# Patient Record
Sex: Male | Born: 1950 | Race: Black or African American | Hispanic: No | Marital: Married | Smoking: Never smoker
Health system: Southern US, Community
[De-identification: ages and names within clinical notes are randomized; demographics above are authoritative.]

## PROBLEM LIST (undated history)

## (undated) DIAGNOSIS — R7303 Prediabetes: Secondary | ICD-10-CM

## (undated) DIAGNOSIS — N433 Hydrocele, unspecified: Secondary | ICD-10-CM

## (undated) DIAGNOSIS — J984 Other disorders of lung: Secondary | ICD-10-CM

## (undated) HISTORY — PX: UMBILICAL HERNIA REPAIR: SHX196

## (undated) HISTORY — DX: Other disorders of lung: J98.4

## (undated) HISTORY — DX: Prediabetes: R73.03

---
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
## 2015-01-13 ENCOUNTER — Encounter: Payer: Self-pay | Admitting: Family Medicine

## 2015-01-20 ENCOUNTER — Ambulatory Visit (INDEPENDENT_AMBULATORY_CARE_PROVIDER_SITE_OTHER): Payer: Worker's Compensation | Admitting: Family Medicine

## 2015-01-20 ENCOUNTER — Ambulatory Visit (INDEPENDENT_AMBULATORY_CARE_PROVIDER_SITE_OTHER): Payer: Worker's Compensation

## 2015-01-20 ENCOUNTER — Encounter: Payer: Self-pay | Admitting: Family Medicine

## 2015-01-20 VITALS — BP 113/84 | HR 86 | Ht 68.0 in | Wt 167.0 lb

## 2015-01-20 DIAGNOSIS — M25561 Pain in right knee: Secondary | ICD-10-CM | POA: Insufficient documentation

## 2015-01-20 DIAGNOSIS — M5441 Lumbago with sciatica, right side: Secondary | ICD-10-CM

## 2015-01-20 DIAGNOSIS — M542 Cervicalgia: Secondary | ICD-10-CM

## 2015-01-20 DIAGNOSIS — M5440 Lumbago with sciatica, unspecified side: Secondary | ICD-10-CM | POA: Insufficient documentation

## 2015-01-20 DIAGNOSIS — M5442 Lumbago with sciatica, left side: Secondary | ICD-10-CM

## 2015-01-20 DIAGNOSIS — M5412 Radiculopathy, cervical region: Secondary | ICD-10-CM | POA: Diagnosis not present

## 2015-01-20 DIAGNOSIS — M4186 Other forms of scoliosis, lumbar region: Secondary | ICD-10-CM

## 2015-01-20 DIAGNOSIS — M25562 Pain in left knee: Secondary | ICD-10-CM

## 2015-01-20 DIAGNOSIS — M1711 Unilateral primary osteoarthritis, right knee: Secondary | ICD-10-CM

## 2015-01-20 DIAGNOSIS — R52 Pain, unspecified: Secondary | ICD-10-CM | POA: Diagnosis not present

## 2015-01-20 MED ORDER — AMITRIPTYLINE HCL 25 MG PO TABS: 25.0000 mg | ORAL_TABLET | Freq: Every day | ORAL | Status: DC

## 2015-01-20 NOTE — Patient Instructions (Addendum)
Thank you for coming in today. Come back or go to the emergency room if you notice new weakness new numbness problems walking or bowel or bladder problems. Take amitriptyline at bedtime for pain as needed.  Return in 4 weeks.   Attend physical therapy.

## 2015-01-20 NOTE — Addendum Note (Signed)
Addended by: Gregor Hams on: 01/20/2015 03:46 PM   Modules accepted: Orders

## 2015-01-20 NOTE — Addendum Note (Signed)
Addended by: Darla Lesches T on: 01/20/2015 03:20 PM   Modules accepted: Medications

## 2015-01-20 NOTE — Assessment & Plan Note (Signed)
X-ray pending. Trial of amitriptyline and physical therapy.

## 2015-01-20 NOTE — Addendum Note (Signed)
Addended by: Gregor Hams on: 01/20/2015 04:39 PM   Modules accepted: Level of Service

## 2015-01-20 NOTE — Assessment & Plan Note (Signed)
Meniscus injury versus DJD. X-ray pending. Return following x-ray.

## 2015-01-20 NOTE — Progress Notes (Addendum)
Michael Gallegos is a 64 y.o. male who presents to [facility]  today for establish care and discuss chronic neck, back and right knee pain.  Patient was injured on the job in 1998. Since then he has permanent disability. He notes neck and back pain moderate. He does have radiating pain and numbness to both hand and feet BL. He notes the hand tingling sometimes wakes up from sleep. Additionally he notes that he has sharp neck pain when he Valsalva's for a bowel movement. He denies any recent injuries weakness or numbness bowel bladder dysfunction fevers or chills. He has not tried any medications recently from naproxen which he takes intermittently.  Patient has right knee pain. This is been ongoing for years. He notes medial knee pain and swelling worse with activity. He notes popping denies locking or catching or giving way. He denies any recent knee injury.  History reviewed. No pertinent past medical history. History reviewed. No pertinent past surgical history. Social History  Substance Use Topics  . Smoking status: Never Smoker   . Smokeless tobacco: Not on file  . Alcohol Use: 0.0 oz/week    0 Standard drinks or equivalent per week   family history is not on file.  ROS as above Medications: Current Outpatient Prescriptions  Medication Sig Dispense Refill  . Naproxen (NAPROSYN PO) Take by mouth.    Marland Kitchen amitriptyline (ELAVIL) 25 MG tablet Take 1 tablet (25 mg total) by mouth at bedtime. 30 tablet 1   No current facility-administered medications for this visit.   No Known Allergies   Exam:  BP 113/84 mmHg  Pulse 86  Ht 5\' 8"  (1.727 m)  Wt 167 lb (75.751 kg)  BMI 25.40 kg/m2  Gen: Well NAD HEENT: EOMI,  MMM Lungs: Normal work of breathing. CTABL Heart: RRR no MRG Abd: NABS, Soft. Nondistended, Nontender Exts: Brisk capillary refill, warm and well perfused.  Neck: Nontender to midline. Normal neck range of motion. Negative Spurling's test  bilaterally. Upper extremity strength reflexes and sensation are intact and equal. Neck: Nontender to midline. Tender palpation bilateral lumbar paraspinals. Normal back range of motion. Largely strength and sensation are intact and equal throughout. Reflexes are equal and normal bilaterally. Knee: Normal-appearing without significant effusion. Normal range of motion. Mildly tender medial joint line. Positive medial McMurray's test. Negative Lachman's valgus and varus stress.   No results found for this or any previous visit (from the past 24 hour(s)). No results found.   Please see individual assessment and plan sections.     Note: Addendum note as patient showed up late for today's visit.

## 2015-01-21 NOTE — Progress Notes (Signed)
Quick Note:  Xray of neck shows lots of arthritis that could explain the pain. The back xray also shows arthritis. Both may need MRI in the future to tell us for sure what is going on if you do not get better. The knee xray also shows arthritis. ______

## 2015-01-27 ENCOUNTER — Ambulatory Visit: Payer: Self-pay | Admitting: Rehabilitative and Restorative Service Providers"

## 2015-02-18 ENCOUNTER — Ambulatory Visit: Payer: Self-pay | Admitting: Family Medicine

## 2015-02-25 ENCOUNTER — Encounter: Payer: Self-pay | Admitting: Family Medicine

## 2015-02-25 ENCOUNTER — Ambulatory Visit (INDEPENDENT_AMBULATORY_CARE_PROVIDER_SITE_OTHER): Payer: Worker's Compensation | Admitting: Family Medicine

## 2015-02-25 VITALS — BP 135/80 | HR 79 | Wt 173.0 lb

## 2015-02-25 DIAGNOSIS — M542 Cervicalgia: Secondary | ICD-10-CM | POA: Diagnosis not present

## 2015-02-25 DIAGNOSIS — M5442 Lumbago with sciatica, left side: Secondary | ICD-10-CM

## 2015-02-25 DIAGNOSIS — K219 Gastro-esophageal reflux disease without esophagitis: Secondary | ICD-10-CM | POA: Insufficient documentation

## 2015-02-25 DIAGNOSIS — M5441 Lumbago with sciatica, right side: Secondary | ICD-10-CM

## 2015-02-25 DIAGNOSIS — M25561 Pain in right knee: Secondary | ICD-10-CM | POA: Diagnosis not present

## 2015-02-25 DIAGNOSIS — M5412 Radiculopathy, cervical region: Secondary | ICD-10-CM

## 2015-02-25 MED ORDER — OMEPRAZOLE 40 MG PO CPDR: 40.0000 mg | DELAYED_RELEASE_CAPSULE | Freq: Every day | ORAL | Status: DC

## 2015-02-25 MED ORDER — NAPROXEN 375 MG PO TABS: 375.0000 mg | ORAL_TABLET | Freq: Two times a day (BID) | ORAL | Status: DC

## 2015-02-25 NOTE — Assessment & Plan Note (Addendum)
Chronic. Likely due to arthritis given XR. Will prescribe naproxen prn for pain. Patient deferred MRI

## 2015-02-25 NOTE — Assessment & Plan Note (Signed)
Chronic, worsening. Will order MRI and follow up thereafter.

## 2015-02-25 NOTE — Assessment & Plan Note (Signed)
Chronic, worsening. Radicular symptoms warrant MRI. Will follow up thereafter.

## 2015-02-25 NOTE — Patient Instructions (Signed)
Thank you for coming in today. You were seen for your neck, back and knee pain.  We will get MRI of your neck and back since the pain is likely coming from pinched nerves coming from your spine.  We will prescribe naproxen for pain as needed. We will prescribe omeprazole for the burning in your stomach. Please follow up 1-2 days after MRI.

## 2015-02-25 NOTE — Assessment & Plan Note (Signed)
Chronic. As above

## 2015-02-25 NOTE — Progress Notes (Signed)
Michael Gallegos is a 64 y.o. male who presents to [facility] today for neck pain, lumbago and R knee pain.   Patient had a work injury over 15 years ago witch chronic pain related to his neck and lower back.   1) The lower back pain bothers him most, with radiation to flanks, front and back of legs with burning in feet. He also notes his left and right legs giving out due to weakness. This is intermittent, usually related to lifting heavy objects. He notes occasional paresthesias. Patient was referred to PT but his insurance did not approve it. Patient has been recommended for surgery in the past, but has deferred to this point. He denies bowel or bladder changes.  For the pain, he tried amitriptyline once but vomited immediately, and has not taken any since.   2) Neck pain: patient notes pain in posterior neck and upper back. When he lifts heavy objects or rotates head, he gets burning pain shooting down both arms and bilateral posterior neck pain. He notes nightly paresthesias but no weakness.   3) R knee pain: chronic given arthritis found on recent XR. Naproxen has helped in past, but he has not had a prescription recently due to no provider.   4) Reflux: He also notes burning about 30 minutes after meals with occasional associated vomiting right after he eats. He has been on omeprazole in the past which has helped but his old doctor passed away and he so he has not had a prescription.   No past medical history on file. No past surgical history on file. Social History  Substance Use Topics  . Smoking status: Never Smoker   . Smokeless tobacco: Not on file  . Alcohol Use: 0.0 oz/week    0 Standard drinks or equivalent per week   family history is not on file.  ROS as above Medications: Current Outpatient Prescriptions  Medication Sig Dispense Refill  . naproxen (NAPROSYN) 375 MG tablet Take 1 tablet (375 mg total) by mouth 2 (two) times daily with a  meal. 60 tablet 2  . omeprazole (PRILOSEC) 40 MG capsule Take 1 capsule (40 mg total) by mouth daily. 30 capsule 12   No current facility-administered medications for this visit.   No Known Allergies   Exam:  BP 135/80 mmHg  Pulse 79  Wt 173 lb (78.472 kg) Gen: Well  Appearing gentleman in NAD on exam table  HEENT: EOMI,  MMM Lungs: Normal work of breathing. CTABL Heart: RRR no MRG Abd: NABS, Soft. Nondistended, Nontender Exts: Brisk capillary refill, warm and well perfused. Pulses x 4 2+ MSK;  Neck tender on base of neck. ROM exercises in all planes reproduce burning sensation shooting down arm as well as bilateral posterior neck pain, but no paresthesias or numbness.  Back pain palpable to central lower back over L4-5 region. Negative straight leg test.  R knee: bony deformity palpated medially with effusion  Neuro: sensation to light touch intact x 4 extremities  No results found for this or any previous visit (from the past 24 hour(s)). No results found.   Please see individual assessment and plan sections.

## 2015-03-09 ENCOUNTER — Other Ambulatory Visit: Payer: Self-pay | Admitting: Family Medicine

## 2015-06-19 DIAGNOSIS — J9811 Atelectasis: Secondary | ICD-10-CM | POA: Diagnosis not present

## 2015-06-19 DIAGNOSIS — R071 Chest pain on breathing: Secondary | ICD-10-CM | POA: Diagnosis not present

## 2015-06-19 DIAGNOSIS — J90 Pleural effusion, not elsewhere classified: Secondary | ICD-10-CM | POA: Diagnosis not present

## 2015-06-19 DIAGNOSIS — R350 Frequency of micturition: Secondary | ICD-10-CM | POA: Diagnosis not present

## 2015-06-20 DIAGNOSIS — R350 Frequency of micturition: Secondary | ICD-10-CM | POA: Diagnosis not present

## 2015-06-24 ENCOUNTER — Ambulatory Visit (INDEPENDENT_AMBULATORY_CARE_PROVIDER_SITE_OTHER): Payer: Medicare Other

## 2015-06-24 ENCOUNTER — Ambulatory Visit (INDEPENDENT_AMBULATORY_CARE_PROVIDER_SITE_OTHER): Payer: Medicare Other | Admitting: Family Medicine

## 2015-06-24 VITALS — BP 136/89 | HR 70 | Resp 12 | Wt 179.0 lb

## 2015-06-24 DIAGNOSIS — R05 Cough: Secondary | ICD-10-CM

## 2015-06-24 DIAGNOSIS — R079 Chest pain, unspecified: Secondary | ICD-10-CM | POA: Diagnosis not present

## 2015-06-24 DIAGNOSIS — J9811 Atelectasis: Secondary | ICD-10-CM

## 2015-06-24 DIAGNOSIS — J40 Bronchitis, not specified as acute or chronic: Secondary | ICD-10-CM | POA: Diagnosis not present

## 2015-06-24 MED ORDER — HYDROCODONE-HOMATROPINE 5-1.5 MG/5ML PO SYRP: 5.0000 mL | ORAL_SOLUTION | Freq: Three times a day (TID) | ORAL | Status: DC | PRN

## 2015-06-24 NOTE — Patient Instructions (Signed)
Thank you for coming in today. Get fasting blood work soon. Return for wellness visit. You should be called about cardiology referral soon. Call or go to the emergency room if you get worse, have trouble breathing, have chest pains, or palpitations.   Chest Wall Pain Chest wall pain is pain in or around the bones and muscles of your chest. Sometimes, an injury causes this pain. Sometimes, the cause may not be known. This pain may take several weeks or longer to get better. HOME CARE INSTRUCTIONS  Pay attention to any changes in your symptoms. Take these actions to help with your pain:   Rest as told by your health care provider.   Avoid activities that cause pain. These include any activities that use your chest muscles or your abdominal and side muscles to lift heavy items.   If directed, apply ice to the painful area:  Put ice in a plastic bag.  Place a towel between your skin and the bag.  Leave the ice on for 20 minutes, 2-3 times per day.  Take over-the-counter and prescription medicines only as told by your health care provider.  Do not use tobacco products, including cigarettes, chewing tobacco, and e-cigarettes. If you need help quitting, ask your health care provider.  Keep all follow-up visits as told by your health care provider. This is important. SEEK MEDICAL CARE IF:  You have a fever.  Your chest pain becomes worse.  You have new symptoms. SEEK IMMEDIATE MEDICAL CARE IF:  You have nausea or vomiting.  You feel sweaty or light-headed.  You have a cough with phlegm (sputum) or you cough up blood.  You develop shortness of breath.   This information is not intended to replace advice given to you by your health care provider. Make sure you discuss any questions you have with your health care provider.   Document Released: 03/06/2005 Document Revised: 11/25/2014 Document Reviewed: 06/01/2014 Elsevier Interactive Patient Education [employer].

## 2015-06-24 NOTE — Progress Notes (Signed)
       Michael Gallegos is a 65 y.o. male who presents to [facility] today for follow-up recent hospitalization. Patient developed vomiting and right-sided chest pain with deep inspiration a few days ago. He was seen in an urgent care locally on April 2.  His EKG was normal as well as his basic labs. A chest x-ray showed left basilar atelectasis and a small pleural effusion. He was given prednisone and tramadol. He notes improvement in symptoms. He specifically mild right-sided chest pain especially with deep inspiration. He denies any central or exertional chest pain. No pain radiating to the left arm or jaw. No significant shortness of breath or wheezing.  Patient notes in the past he has done well with Hycodan cough syrup and would like a refill of that if possible.   No past medical history on file. No past surgical history on file. Social History  Substance Use Topics  . Smoking status: Never Smoker   . Smokeless tobacco: Not on file  . Alcohol Use: 0.0 oz/week    0 Standard drinks or equivalent per week   family history is not on file.  ROS as above Medications: Current Outpatient Prescriptions  Medication Sig Dispense Refill  . predniSONE (DELTASONE) 20 MG tablet     . traMADol (ULTRAM) 50 MG tablet Take by mouth.    Marland Kitchen HYDROcodone-homatropine (HYCODAN) 5-1.5 MG/5ML syrup Take 5 mLs by mouth every 8 (eight) hours as needed for cough. 180 mL 0  . naproxen (NAPROSYN) 375 MG tablet Take 1 tablet (375 mg total) by mouth 2 (two) times daily with a meal. 60 tablet 2  . Omeprazole 20 MG TBEC Take by mouth.     No current facility-administered medications for this visit.   No Known Allergies   Exam:  BP 136/89 mmHg  Pulse 70  Resp 12  Wt 179 lb (81.194 kg)  SpO2 95% Gen: Well NAD HEENT: EOMI,  MMM Lungs: Normal work of breathing. CTABL Chest wall is nontender. Heart: RRR no  MRG Abd: NABS, Soft. Nondistended, Nontender Exts: Brisk capillary refill, warm and well perfused.   Chest x-ray: No significant atelectasis or pleural effusion visible. Awaiting formal radiology review.  12-lead EKG: Normal sinus rhythm at 60 bpm. No Q waves. No ST segment elevation. Flattened lateral precordial T waves are present. No EKGs to compare to.  No results found for this or any previous visit (from the past 24 hour(s)). No results found.   Please see individual assessment and plan sections.

## 2015-06-24 NOTE — Assessment & Plan Note (Addendum)
Chest pain is likely related to bronchitis or pleuritis. X-ray read pending but looks pretty normal to me. EKG with mild lateral precordial T waves. Will refer to cardiology for further evaluation of the chest pain. Patient may benefit from a stress test. Refill Hycodan. Discontinue tramadol. Continue prednisone. Follow-up for wellness visit in a few weeks Obtain fasting blood work.

## 2015-06-25 DIAGNOSIS — J90 Pleural effusion, not elsewhere classified: Secondary | ICD-10-CM | POA: Diagnosis not present

## 2015-06-25 DIAGNOSIS — R918 Other nonspecific abnormal finding of lung field: Secondary | ICD-10-CM | POA: Diagnosis not present

## 2015-06-25 NOTE — Addendum Note (Signed)
Addended by: Huel Cote on: 06/25/2015 10:57 AM   Modules accepted: Orders

## 2015-06-25 NOTE — Progress Notes (Signed)
Quick Note:  Xray is pretty normal appearing  ______

## 2015-07-05 ENCOUNTER — Telehealth: Payer: Self-pay | Admitting: Family Medicine

## 2015-07-05 DIAGNOSIS — J40 Bronchitis, not specified as acute or chronic: Secondary | ICD-10-CM | POA: Diagnosis not present

## 2015-07-05 DIAGNOSIS — Z1329 Encounter for screening for other suspected endocrine disorder: Secondary | ICD-10-CM | POA: Diagnosis not present

## 2015-07-05 DIAGNOSIS — R7303 Prediabetes: Secondary | ICD-10-CM | POA: Diagnosis not present

## 2015-07-05 DIAGNOSIS — Z13220 Encounter for screening for lipoid disorders: Secondary | ICD-10-CM | POA: Diagnosis not present

## 2015-07-05 DIAGNOSIS — E559 Vitamin D deficiency, unspecified: Secondary | ICD-10-CM | POA: Diagnosis not present

## 2015-07-05 MED ORDER — HYDROCODONE-HOMATROPINE 5-1.5 MG/5ML PO SYRP: 5.0000 mL | ORAL_SOLUTION | Freq: Three times a day (TID) | ORAL | Status: DC | PRN

## 2015-07-05 NOTE — Telephone Encounter (Signed)
Patient lost his Hycodan prescriptions as well as his labs from the last visit. He would like a refill if possible. I researched the patient in [place] controlled substance database and he has not filled this medicine and I called his preferred pharmacy and they have not filled this medicine. I do generally believe he lost it. We will refill.

## 2015-07-06 ENCOUNTER — Encounter: Payer: Self-pay | Admitting: Family Medicine

## 2015-07-06 DIAGNOSIS — E559 Vitamin D deficiency, unspecified: Secondary | ICD-10-CM | POA: Insufficient documentation

## 2015-07-06 DIAGNOSIS — R7303 Prediabetes: Secondary | ICD-10-CM | POA: Insufficient documentation

## 2015-07-06 LAB — COMPREHENSIVE METABOLIC PANEL
ALBUMIN: 4 g/dL (ref 3.6–5.1)
ALK PHOS: 45 U/L (ref 40–115)
ALT: 16 U/L (ref 9–46)
AST: 14 U/L (ref 10–35)
BUN: 15 mg/dL (ref 7–25)
CALCIUM: 9 mg/dL (ref 8.6–10.3)
CHLORIDE: 104 mmol/L (ref 98–110)
CO2: 26 mmol/L (ref 20–31)
Creat: 1 mg/dL (ref 0.70–1.25)
GLUCOSE: 89 mg/dL (ref 65–99)
Potassium: 4.2 mmol/L (ref 3.5–5.3)
SODIUM: 139 mmol/L (ref 135–146)
Total Bilirubin: 0.7 mg/dL (ref 0.2–1.2)
Total Protein: 6.5 g/dL (ref 6.1–8.1)

## 2015-07-06 LAB — HEMOGLOBIN A1C
Hgb A1c MFr Bld: 6.3 % — ABNORMAL HIGH (ref <5.7)
Mean Plasma Glucose: 134 mg/dL

## 2015-07-06 LAB — LIPID PANEL
Cholesterol: 187 mg/dL (ref 125–200)
HDL: 49 mg/dL (ref >=40)
LDL CALC: 104 mg/dL (ref <130)
TRIGLYCERIDES: 169 mg/dL — AB (ref <150)
Total CHOL/HDL Ratio: 3.8 Ratio (ref <=5.0)
VLDL: 34 mg/dL — AB (ref <30)

## 2015-07-06 LAB — CBC
HEMATOCRIT: 42.6 % (ref 38.5–50.0)
HEMOGLOBIN: 14.2 g/dL (ref 13.2–17.1)
MCH: 29.3 pg (ref 27.0–33.0)
MCHC: 33.3 g/dL (ref 32.0–36.0)
MCV: 88 fL (ref 80.0–100.0)
MPV: 9 fL (ref 7.5–12.5)
Platelets: 229 10*3/uL (ref 140–400)
RBC: 4.84 MIL/uL (ref 4.20–5.80)
RDW: 14.6 % (ref 11.0–15.0)
WBC: 5.1 10*3/uL (ref 3.8–10.8)

## 2015-07-06 LAB — VITAMIN D 25 HYDROXY (VIT D DEFICIENCY, FRACTURES): Vit D, 25-Hydroxy: 20 ng/mL — ABNORMAL LOW (ref 30–100)

## 2015-07-06 LAB — TSH: TSH: 0.86 mIU/L (ref 0.40–4.50)

## 2015-07-06 NOTE — Progress Notes (Signed)
Quick Note:  1) Vitamin D deficiency noted. Take 2000 units of vitamin D daily over-the-counter.  2) You have prediabetes. Work on lowering carbs in the diet. We will discuss this further. ______

## 2015-07-20 ENCOUNTER — Ambulatory Visit (INDEPENDENT_AMBULATORY_CARE_PROVIDER_SITE_OTHER): Payer: Medicare Other | Admitting: Family Medicine

## 2015-07-20 ENCOUNTER — Encounter: Payer: Self-pay | Admitting: Family Medicine

## 2015-07-20 VITALS — BP 120/78 | HR 97 | Wt 167.0 lb

## 2015-07-20 DIAGNOSIS — R55 Syncope and collapse: Secondary | ICD-10-CM | POA: Diagnosis not present

## 2015-07-20 MED ORDER — ZOSTER VACCINE LIVE 19400 UNT/0.65ML ~~LOC~~ SUSR: 0.6500 mL | Freq: Once | SUBCUTANEOUS | Status: DC

## 2015-07-20 NOTE — Patient Instructions (Signed)
Thank you for coming in today. We will refer to cardiology.  Return in 3 months.  Call or go to the emergency room if you get worse, have trouble breathing, have chest pains, or palpitations.

## 2015-07-20 NOTE — Progress Notes (Signed)
       Michael Gallegos is a 65 y.o. male who presents to [facility] today for follow up congestion and discuss history of syncope and vision loss.   1) Congestion: Patient was seen last month for bronchitis. He is feeling much better now with no fever, chills, CP, palpitations.   2) History of syncope. When asked Mr Macnab notes a history several times in his life where he has had syncopal events. This has occurred with dehydration but also with exertion without dehydration. He also notes a remote (more than 6 months) history of brief dizziness with partial visual field loss. He denies any other neurologic defects during these events.    No past medical history on file. No past surgical history on file. Social History  Substance Use Topics  . Smoking status: Never Smoker   . Smokeless tobacco: Not on file  . Alcohol Use: 0.0 oz/week    0 Standard drinks or equivalent per week   family history is not on file.  ROS as above Medications: Current Outpatient Prescriptions  Medication Sig Dispense Refill  . naproxen (NAPROSYN) 375 MG tablet Take 1 tablet (375 mg total) by mouth 2 (two) times daily with a meal. 60 tablet 2  . Omeprazole 20 MG TBEC Take by mouth.    . Zoster Vaccine Live, PF, (ZOSTAVAX) 36644 UNT/0.65ML injection Inject 19,400 Units into the skin once. If given in pharmacy fax report to Dr Georgina Snell [phone] 1 each 0   No current facility-administered medications for this visit.   No Known Allergies   Exam:  BP 120/78 mmHg  Pulse 97  Wt 167 lb (75.751 kg) Gen: Well NAD HEENT: EOMI,  MMM, PERRLA Lungs: Normal work of breathing. CTABL Heart: RRR no MRG Abd: NABS, Soft. Nondistended, Nontender Exts: Brisk capillary refill, warm and well perfused.  Neuro: AOx3 normal coordination, balance strength and gait.   EKG dated April 2017 reviewed.   Lipid panel reviewed. 10  year CVD risk <10%  No results found for this or any previous visit (from the past 24 hour(s)). No results found.   65 yo male with syncope history. Somewhat concerning. I think he may benefit from stress evaluation likely stress echo vs nuclear stress test.  Plan to refer to cardiology for further evaluation. He will f/u with me in few months. Will likely proceed with carotid US if not other explanation of symptoms.   Of note patient declined statins. His 10 year risk factor for CVD events is <10%.

## 2015-08-04 ENCOUNTER — Encounter: Payer: Self-pay | Admitting: Cardiology

## 2015-08-04 ENCOUNTER — Ambulatory Visit (INDEPENDENT_AMBULATORY_CARE_PROVIDER_SITE_OTHER): Payer: Medicare Other | Admitting: Cardiology

## 2015-08-04 VITALS — BP 116/81 | HR 93 | Ht 68.0 in | Wt 169.1 lb

## 2015-08-04 DIAGNOSIS — R55 Syncope and collapse: Secondary | ICD-10-CM

## 2015-08-04 DIAGNOSIS — R072 Precordial pain: Secondary | ICD-10-CM | POA: Diagnosis not present

## 2015-08-04 DIAGNOSIS — R079 Chest pain, unspecified: Secondary | ICD-10-CM | POA: Insufficient documentation

## 2015-08-04 NOTE — Progress Notes (Signed)
     HPI:  65 year old male for evaluation of syncope. Laboratories April 2017 showed normal hemoglobin, potassium 4.2, BUN/creatinine 15/1.0, normal TSH and chest x-ray with no acute findings. Patient states he has had 2 syncopal episodes in his life. Last was several years ago. He states he was in the sun and working hard and then had syncope. He does not have dyspnea on exertion, orthopnea, PND, pedal edema. He has chest heaviness. It is substernal and radiates to the back. It occurs with exertion and also after drinking certain liquids. It can last 2 days. He also describes occasional dizziness and tingling in his fingers.  Current Outpatient Prescriptions  Medication Sig Dispense Refill  . omeprazole (PRILOSEC) 40 MG capsule Take 40 mg by mouth daily.    . predniSONE (DELTASONE) 20 MG tablet Take 20 mg by mouth as directed.    . traMADol (ULTRAM) 50 MG tablet Take by mouth every 6 (six) hours as needed for moderate pain.     No current facility-administered medications for this visit.    No Known Allergies   Past Medical History  Diagnosis Date  . Prediabetes     Past Surgical History  Procedure Laterality Date  . Umbilical hernia repair      Social History   Social History  . Marital Status: Married    Spouse Name: N/A  . Number of Children: 7  . Years of Education: N/A   Occupational History  . Not on file.   Social History Main Topics  . Smoking status: Never Smoker   . Smokeless tobacco: Not on file  . Alcohol Use: 0.0 oz/week    0 Standard drinks or equivalent per week     Comment: Occasional  . Drug Use: No  . Sexual Activity: Not on file   Other Topics Concern  . Not on file   Social History Narrative    Family History  Problem Relation Age of Onset  . Hypertension Father   . Heart disease Neg Hx     ROS:  Leg and knee arthralgias but no fevers or chills, productive cough, hemoptysis, dysphasia, odynophagia, melena, hematochezia, dysuria,  hematuria, rash, seizure activity, orthopnea, PND, pedal edema, claudication. Remaining systems are negative.  Physical Exam:   Blood pressure 116/81, pulse 93, height 5\' 8"  (1.727 m), weight 169 lb 1.9 oz (76.712 kg).  General:  Well developed/well nourished in NAD Skin warm/dry Patient not depressed No peripheral clubbing Back-normal HEENT-normal/normal eyelids Neck supple/normal carotid upstroke bilaterally; no bruits; no JVD; no thyromegaly chest - CTA/ normal expansion CV - RRR/normal S1 and S2; no murmurs, rubs or gallops;  PMI nondisplaced Abdomen -NT/ND, no HSM, no mass, + bowel sounds, no bruit 2+ femoral pulses, no bruits Ext-no edema, chords, 2+ DP Neuro-grossly nonfocal  ECG 06/24/2015-Sinus rhythm, anterior T-wave inversion.

## 2015-08-04 NOTE — Assessment & Plan Note (Addendum)
Patient has had 2 previous syncopal episodes likely secondary to dehydration. No recent episodes in the past several years. We'll arrange echocardiogram to assess LV function.

## 2015-08-04 NOTE — Assessment & Plan Note (Addendum)
Symptoms difficult to assess. He has some exertional chest pain but his symptoms also occur after drinking certain liquids. Plan stress echocardiogram. If normal we'll not pursue further ischemia evaluation. Electrocardiogram shows anterior T-wave inversion and therefore he will require echo imaging with the stress test.

## 2015-08-04 NOTE — Patient Instructions (Signed)
Medication Instructions:   NO CHANGE  Testing/Procedures:  Your physician has requested that you have an echocardiogram. Echocardiography is a painless test that uses sound waves to create images of your heart. It provides your doctor with information about the size and shape of your heart and how well your heart's chambers and valves are working. This procedure takes approximately one hour. There are no restrictions for this procedure.   Your physician has requested that you have a stress echocardiogram. For further information please visit [email or web address]. Please follow instruction sheet as given.    Follow-Up:  Your physician recommends that you schedule a follow-up appointment in: AS NEEDED PENDING TEST RESULTS   Exercise Stress Echocardiogram An exercise stress echocardiogram is a heart (cardiac) test used to check the function of your heart. This test may also be called an exercise stress echocardiography or stress echo. This stress test will check how well your heart muscle and valves are working and determine if your heart muscle is getting enough blood. You will exercise on a treadmill to naturally increase or stress the functioning of your heart.  An echocardiogram uses sound waves (ultrasound) to produce an image of your heart. If your heart does not work normally, it may indicate coronary artery disease with poor coronary blood supply. The coronary arteries are the arteries that bring blood and oxygen to your heart. LET [facility] CARE PROVIDER KNOW ABOUT:  Any allergies you have.  All medicines you are taking, including vitamins, herbs, eye drops, creams, and over-the-counter medicines.  Previous problems you or members of your family have had with the use of anesthetics.  Any blood disorders you have.  Previous surgeries you have had.  Medical conditions you have.  Possibility of pregnancy, if this applies. RISKS AND COMPLICATIONS Generally, this is a safe  procedure. However, as with any procedure, complications can occur. Possible complications can include:  You develop pain or pressure in the following areas:  Chest.  Jaw or neck.  Between your shoulder blades.  Radiating down your left arm.  Dizziness or lightheadedness.  Shortness of breath.  Increased or irregular heartbeat.  Nausea or vomiting.  Heart attack (rare). BEFORE THE PROCEDURE  Avoid all forms of caffeine for 24 hours before your test or as directed by your health care provider. This includes coffee, tea (even decaffeinated tea), caffeinated sodas, chocolate, cocoa, and certain pain medicines.  Follow your health care provider's instructions regarding eating and drinking before the test.  Take your medicines as directed at regular times with water unless instructed otherwise. Exceptions may include:  If you have diabetes, ask how you are to take your insulin or pills. It is common to adjust insulin dosing the morning of the test.  If you are taking beta-blocker medicines, it is important to talk to your health care provider about these medicines well before the date of your test. Taking beta-blocker medicines may interfere with the test. In some cases, these medicines need to be changed or stopped 24 hours or more before the test.  If you wear a nitroglycerin patch, it may need to be removed prior to the test. Ask your health care provider if the patch should be removed before the test.  If you use an inhaler for any breathing condition, bring it with you to the test.  If you are an outpatient, bring a snack so you can eat right after the stress phase of the test.  Do not smoke for 4 hours prior to the  test or as directed by your health care provider.  Wear loose-fitting clothes and comfortable shoes for the test. This test involves walking on a treadmill. PROCEDURE   Multiple electrodes will be put on your chest. If needed, small areas of your chest may be  shaved to get better contact with the electrodes. Once the electrodes are attached to your body, multiple wires will be attached to the electrodes, and your heart rate will be monitored.  You will have an echocardiogram done at rest.  To produce this image of your heart, gel is applied to your chest, and a wand-like tool (transducer) is moved over the chest. The transducer sends the sound waves through the chest to create the moving images of your heart.  You may need an IV to receive a medication that improves the quality of the pictures.  You will then walk on a treadmill. The treadmill will be started at a slow pace. The treadmill speed and incline will gradually be increased to raise your heart rate.  At the peak of exercise, the treadmill will be stopped. You will lie down immediately on a bed so that a second echocardiogram can be done to visualize your heart's motion with exercise.  The test usually takes 30-60 minutes to complete. AFTER THE PROCEDURE  Your heart rate and blood pressure will be monitored after the test.  You may return to your normal schedule, including diet, activities, and medicines, unless your health care provider tells you otherwise.   This information is not intended to replace advice given to you by your health care provider. Make sure you discuss any questions you have with your health care provider.   Document Released: 03/10/2004 Document Revised: 03/11/2013 Document Reviewed: 11/11/2012 Elsevier Interactive Patient Education [employer].

## 2015-08-20 ENCOUNTER — Telehealth (HOSPITAL_COMMUNITY): Payer: Self-pay | Admitting: *Deleted

## 2015-08-20 NOTE — Telephone Encounter (Signed)
Left message on voicemail in reference to upcoming appointment scheduled for 08/23/15. Phone number given for a call back so details instructions can be given. Michael Gallegos

## 2015-08-23 ENCOUNTER — Ambulatory Visit (HOSPITAL_BASED_OUTPATIENT_CLINIC_OR_DEPARTMENT_OTHER): Payer: Medicare Other

## 2015-08-23 ENCOUNTER — Ambulatory Visit (HOSPITAL_COMMUNITY): Payer: Medicare Other | Attending: Cardiology

## 2015-08-23 ENCOUNTER — Other Ambulatory Visit: Payer: Self-pay

## 2015-08-23 DIAGNOSIS — R55 Syncope and collapse: Secondary | ICD-10-CM

## 2015-08-23 DIAGNOSIS — I517 Cardiomegaly: Secondary | ICD-10-CM | POA: Insufficient documentation

## 2015-08-23 DIAGNOSIS — I071 Rheumatic tricuspid insufficiency: Secondary | ICD-10-CM | POA: Insufficient documentation

## 2015-08-23 LAB — ECHOCARDIOGRAM COMPLETE
CHL CUP DOP CALC LVOT VTI: 21.1 cm
E decel time: 204 msec
EERAT: 6.61
FS: 34 % (ref 28–44)
IV/PV OW: 0.84
LA diam end sys: 36 cm
LA vol: 40 cm3
LADIAMINDEX: 1.89 cm/m2
LASIZE: 36 cm
LAVOLA4C: 37 mL
LAVOLIN: 21.1 mL/m2
LDCA: 3.8 cm2
LV E/e' medial: 6.61
LV PW d: 13.4 mm — AB (ref 0.6–1.1)
LV TDI E'MEDIAL: 8.33
LVEEAVG: 6.61
LVELAT: 9.87 cm/s
LVOT SV: 80 cm3
LVOT diameter: 22 mm
LVOT peak vel: 105 cm/s
MV Dec: 204
MVPKAVEL: 63.7 m/s
MVPKEVEL: 65.2 m/s
Reg peak vel: 263 cm/s
TDI e' lateral: 9.87
TR max vel: 263 m/s

## 2015-08-24 DIAGNOSIS — J920 Pleural plaque with presence of asbestos: Secondary | ICD-10-CM | POA: Insufficient documentation

## 2015-08-24 DIAGNOSIS — R05 Cough: Secondary | ICD-10-CM | POA: Diagnosis not present

## 2015-08-27 ENCOUNTER — Telehealth: Payer: Self-pay | Admitting: Cardiology

## 2015-08-27 NOTE — Telephone Encounter (Signed)
Did not need this encounter °

## 2015-09-08 ENCOUNTER — Encounter: Payer: Self-pay | Admitting: *Deleted

## 2015-10-13 ENCOUNTER — Encounter: Payer: Self-pay | Admitting: Family Medicine

## 2015-10-13 ENCOUNTER — Ambulatory Visit (INDEPENDENT_AMBULATORY_CARE_PROVIDER_SITE_OTHER): Payer: Medicare Other | Admitting: Family Medicine

## 2015-10-13 VITALS — BP 127/76 | HR 95 | Temp 98.1°F | Wt 170.0 lb

## 2015-10-13 DIAGNOSIS — L237 Allergic contact dermatitis due to plants, except food: Secondary | ICD-10-CM

## 2015-10-13 MED ORDER — TRIAMCINOLONE ACETONIDE 0.1 % EX CREA: 1.0000 "application " | TOPICAL_CREAM | Freq: Two times a day (BID) | CUTANEOUS | 0 refills | Status: DC

## 2015-10-13 MED ORDER — PREDNISONE 5 MG (48) PO TBPK: ORAL_TABLET | ORAL | 0 refills | Status: DC

## 2015-10-13 NOTE — Patient Instructions (Signed)
Thank you for coming in today.   Poison [employer] ivy is a inflammation of the skin (contact dermatitis) caused by touching the allergens on the leaves of the ivy plant following previous exposure to the plant. The rash usually appears 48 hours after exposure. The rash is usually bumps (papules) or blisters (vesicles) in a linear pattern. Depending on your own sensitivity, the rash may simply cause redness and itching, or it may also progress to blisters which may break open. These must be well cared for to prevent secondary bacterial (germ) infection, followed by scarring. Keep any open areas dry, clean, dressed, and covered with an antibacterial ointment if needed. The eyes may also get puffy. The puffiness is worst in the morning and gets better as the day progresses. This dermatitis usually heals without scarring, within 2 to 3 weeks without treatment. HOME CARE INSTRUCTIONS  Thoroughly wash with soap and water as soon as you have been exposed to poison ivy. You have about one half hour to remove the plant resin before it will cause the rash. This washing will destroy the oil or antigen on the skin that is causing, or will cause, the rash. Be sure to wash under your fingernails as any plant resin there will continue to spread the rash. Do not rub skin vigorously when washing affected area. Poison ivy cannot spread if no oil from the plant remains on your body. A rash that has progressed to weeping sores will not spread the rash unless you have not washed thoroughly. It is also important to wash any clothes you have been wearing as these may carry active allergens. The rash will return if you wear the unwashed clothing, even several days later. Avoidance of the plant in the future is the best measure. Poison ivy plant can be recognized by the number of leaves. Generally, poison ivy has three leaves with flowering branches on a single stem. Diphenhydramine may be purchased over the counter and used as  needed for itching. Do not drive with this medication if it makes you drowsy.Ask your caregiver about medication for children. SEEK MEDICAL CARE IF:  Open sores develop.  Redness spreads beyond area of rash.  You notice purulent (pus-like) discharge.  You have increased pain.  Other signs of infection develop (such as fever).   This information is not intended to replace advice given to you by your health care provider. Make sure you discuss any questions you have with your health care provider.   Document Released: 03/03/2000 Document Revised: 05/29/2011 Document Reviewed: 08/12/2014 Elsevier Interactive Patient Education [employer].

## 2015-10-13 NOTE — Progress Notes (Signed)
       Amarey Kok is a 65 y.o. male who presents to [place] today for poison ivy dermatitis. Patient was exposed to poison ivy last week. Is not worsening. Anicteric erythematous rash on his trunk and extremities extremities. He's tried using over-the-counter medications and creams including Benadryl hydrocortisone cream and has not had much benefit. He denies any fevers or chills   Past Medical History:  Diagnosis Date  . Prediabetes    Past Surgical History:  Procedure Laterality Date  . UMBILICAL HERNIA REPAIR     Social History  Substance Use Topics  . Smoking status: Never Smoker  . Smokeless tobacco: Not on file  . Alcohol use 0.0 oz/week     Comment: Occasional   family history includes Hypertension in his father.  ROS as above:  Medications: Current Outpatient Prescriptions  Medication Sig Dispense Refill  . omeprazole (PRILOSEC) 40 MG capsule Take 40 mg by mouth daily.    . predniSONE (STERAPRED UNI-PAK 48 TAB) 5 MG (48) TBPK tablet 12 day dosepack po 48 tablet 0  . triamcinolone cream (KENALOG) 0.1 % Apply 1 application topically 2 (two) times daily. 453.6 g 0   No current facility-administered medications for this visit.    No Known Allergies   Exam:  BP 127/76   Pulse 95   Temp 98.1 F (36.7 C) (Oral)   Wt 170 lb (77.1 kg)   SpO2 97%   BMI 25.85 kg/m  Gen: Well NAD Skin: Multiple areas of erythematous maculopapular rash with some vesicles. Nontender. Blanchable. Rashes occurs on trunk and extremities  No results found for this or any previous visit (from the past 24 hour(s)). No results found.    Assessment and Plan: 65 y.o. male with poison ivy dermatitis. Due to widespread distribution we'll use prednisone Dosepak and topical triamcinolone cream. Return as needed.   No orders of the defined types were placed in this  encounter.   Discussed warning signs or symptoms. Please see discharge instructions. Patient expresses understanding.

## 2015-10-21 ENCOUNTER — Ambulatory Visit (INDEPENDENT_AMBULATORY_CARE_PROVIDER_SITE_OTHER): Payer: Medicare Other

## 2015-10-21 ENCOUNTER — Ambulatory Visit (INDEPENDENT_AMBULATORY_CARE_PROVIDER_SITE_OTHER): Payer: Medicare Other | Admitting: Family Medicine

## 2015-10-21 ENCOUNTER — Encounter: Payer: Self-pay | Admitting: Family Medicine

## 2015-10-21 VITALS — BP 145/84 | HR 85 | Wt 170.0 lb

## 2015-10-21 DIAGNOSIS — M549 Dorsalgia, unspecified: Secondary | ICD-10-CM | POA: Diagnosis not present

## 2015-10-21 DIAGNOSIS — R350 Frequency of micturition: Secondary | ICD-10-CM | POA: Diagnosis not present

## 2015-10-21 DIAGNOSIS — R319 Hematuria, unspecified: Secondary | ICD-10-CM | POA: Diagnosis not present

## 2015-10-21 DIAGNOSIS — R1084 Generalized abdominal pain: Secondary | ICD-10-CM | POA: Diagnosis not present

## 2015-10-21 LAB — POCT URINALYSIS DIPSTICK
BILIRUBIN UA: NEGATIVE
GLUCOSE UA: NEGATIVE
KETONES UA: NEGATIVE
LEUKOCYTES UA: NEGATIVE
Nitrite, UA: NEGATIVE
PROTEIN UA: NEGATIVE
SPEC GRAV UA: 1.02
Urobilinogen, UA: 0.2
pH, UA: 6

## 2015-10-21 MED ORDER — POLYETHYLENE GLYCOL 3350 17 GM/SCOOP PO POWD: 17.0000 g | Freq: Every day | ORAL | 1 refills | Status: DC

## 2015-10-21 MED ORDER — CIPROFLOXACIN HCL 500 MG PO TABS: 500.0000 mg | ORAL_TABLET | Freq: Two times a day (BID) | ORAL | 0 refills | Status: DC

## 2015-10-21 NOTE — Patient Instructions (Addendum)
Thank you for coming in today. We will call with lab results.  Take Cipro antibiotics twice daily for 10 days.  Return if worse or not better.  If your belly pain worsens, or you have high fever, bad vomiting, blood in your stool or black tarry stool go to the Emergency Room.

## 2015-10-21 NOTE — Progress Notes (Signed)
Michael Gallegos is a 65 y.o. male who presents to [place] today for abdominal pain. Patient notes lower abdominal pain with urinary frequency and urgency. Symptoms present for a few days. No fevers chills vomiting or diarrhea. No frank chest pain. No radiating pain. He notes he was recently given prednisone for poison ivy. He stopped taking the prednisone and symptoms of not improved. Pain is felt bilaterally in the pelvis and tends to come and go. Patient notes a chronic history of constipation and he notes that he has been little more constipated than usual recently   Past Medical History:  Diagnosis Date  . Prediabetes    Past Surgical History:  Procedure Laterality Date  . UMBILICAL HERNIA REPAIR     Social History  Substance Use Topics  . Smoking status: Never Smoker  . Smokeless tobacco: Not on file  . Alcohol use 0.0 oz/week     Comment: Occasional   family history includes Hypertension in his father.  ROS as above:  Medications: Current Outpatient Prescriptions  Medication Sig Dispense Refill  . omeprazole (PRILOSEC) 40 MG capsule Take 40 mg by mouth daily.    . predniSONE (STERAPRED UNI-PAK 48 TAB) 5 MG (48) TBPK tablet 12 day dosepack po 48 tablet 0  . triamcinolone cream (KENALOG) 0.1 % Apply 1 application topically 2 (two) times daily. 453.6 g 0  . ciprofloxacin (CIPRO) 500 MG tablet Take 1 tablet (500 mg total) by mouth 2 (two) times daily. 20 tablet 0  . polyethylene glycol powder (GLYCOLAX/MIRALAX) powder Take 17 g by mouth daily. 850 g 1   No current facility-administered medications for this visit.    No Known Allergies   Exam:  BP (!) 145/84   Pulse 85   Wt 170 lb (77.1 kg)   SpO2 100%   BMI 25.85 kg/m  Gen: Well NAD HEENT: EOMI,  MMM Lungs: Normal work of breathing. CTABL Heart: RRR no MRG Abd: NABS, Soft. Nondistended,  Mildly tender with no rebound or guarding or masses palpated. No CVA angle tenderness to percussion Exts: Brisk capillary refill, warm and well perfused.   Results for orders placed or performed in visit on 10/21/15 (from the past 24 hour(s))  POCT Urinalysis Dipstick     Status: Abnormal   Collection Time: 10/21/15  1:51 PM  Result Value Ref Range   Color, UA yellow    Clarity, UA clear    Glucose, UA neg    Bilirubin, UA neg    Ketones, UA neg    Spec Grav, UA 1.020    Blood, UA trace-lysed    pH, UA 6.0    Protein, UA neg    Urobilinogen, UA 0.2    Nitrite, UA neg    Leukocytes, UA Negative Negative   Ct Renal Stone Study  Result Date: 10/21/2015 CLINICAL DATA:  Back pain radiating into the abdomen for 5-6 days. Hematuria. Initial encounter. EXAM: CT ABDOMEN AND PELVIS WITHOUT CONTRAST TECHNIQUE: Multidetector CT imaging of the abdomen and pelvis was performed following the standard protocol without IV contrast. COMPARISON:  None. FINDINGS: Small calcified pleural plaques are seen in the lower lung zones bilaterally. Emphysematous change in the lung bases is noted. No pleural or pericardial effusion. The kidneys appear normal bilaterally. No renal or ureteral stones are seen on the right or left. No urinary bladder stones are identified. Prostate gland is unremarkable. The gallbladder, spleen, adrenal glands and pancreas appear normal.  A few small low attenuating lesions in the liver cannot be definitively characterized but are likely cysts. The stomach, small and large bowel and appendix appear normal. No lymphadenopathy or fluid. Small fat containing inguinal hernias are seen bilaterally. No lytic or sclerotic bony lesion. Lower lumbar facet degenerative disease is noted. IMPRESSION: Negative for urinary tract stone. No acute abnormality or finding to explain the patient's symptoms. Small fat containing inguinal hernias bilaterally. 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 Electronically Signed   By: Inge Rise M.D.   On: 10/21/2015 14:31      Assessment and Plan: 65 y.o. male with abdominal pain was hematuria and urinary frequency. Initially concern for kidney stones however as noted above the CT scan was negative for stones. I'm more concerned about urinary tract infection at this point. Plan for urine culture and empiric treatment with Cipro. Additionally symptoms may be due to constipation. Will treat with MiraLAX. Additionally obtain CBC CMP and lipase. Return if not better.    Orders Placed This Encounter  Procedures  . Urine Culture  . CT RENAL STONE STUDY    Standing Status:   Future    Number of Occurrences:   1    Standing Expiration Date:   01/20/2017    Order Specific Question:   Reason for Exam (SYMPTOM  OR DIAGNOSIS REQUIRED)    Answer:   eval possible kidney stone. Pain and hematuria    Order Specific Question:   Preferred imaging location?    Answer:   [facility]  . CBC  . Comprehensive metabolic panel    Order Specific Question:   Has the patient fasted?    Answer:   No  . Lipase  . POCT Urinalysis Dipstick    Discussed warning signs or symptoms. Please see discharge instructions. Patient expresses understanding.

## 2015-10-22 LAB — CBC
HCT: 44.2 % (ref 38.5–50.0)
Hemoglobin: 15 g/dL (ref 13.2–17.1)
MCH: 30.4 pg (ref 27.0–33.0)
MCHC: 33.9 g/dL (ref 32.0–36.0)
MCV: 89.7 fL (ref 80.0–100.0)
MPV: 9 fL (ref 7.5–12.5)
PLATELETS: 271 10*3/uL (ref 140–400)
RBC: 4.93 MIL/uL (ref 4.20–5.80)
RDW: 13.5 % (ref 11.0–15.0)
WBC: 5.2 10*3/uL (ref 3.8–10.8)

## 2015-10-22 LAB — LIPASE: LIPASE: 30 U/L (ref 7–60)

## 2015-10-22 LAB — COMPREHENSIVE METABOLIC PANEL
ALT: 13 U/L (ref 9–46)
AST: 14 U/L (ref 10–35)
Albumin: 4.4 g/dL (ref 3.6–5.1)
Alkaline Phosphatase: 49 U/L (ref 40–115)
BUN: 14 mg/dL (ref 7–25)
CHLORIDE: 99 mmol/L (ref 98–110)
CO2: 30 mmol/L (ref 20–31)
Calcium: 9.5 mg/dL (ref 8.6–10.3)
Creat: 0.95 mg/dL (ref 0.70–1.25)
Glucose, Bld: 84 mg/dL (ref 65–99)
POTASSIUM: 4.8 mmol/L (ref 3.5–5.3)
Sodium: 136 mmol/L (ref 135–146)
TOTAL PROTEIN: 7.2 g/dL (ref 6.1–8.1)
Total Bilirubin: 0.4 mg/dL (ref 0.2–1.2)

## 2015-10-23 LAB — URINE CULTURE: Organism ID, Bacteria: NO GROWTH

## 2015-11-11 ENCOUNTER — Encounter: Payer: Self-pay | Admitting: Family Medicine

## 2015-12-07 ENCOUNTER — Other Ambulatory Visit: Payer: Self-pay | Admitting: Family Medicine

## 2015-12-07 DIAGNOSIS — M5441 Lumbago with sciatica, right side: Secondary | ICD-10-CM

## 2015-12-07 DIAGNOSIS — M5442 Lumbago with sciatica, left side: Secondary | ICD-10-CM

## 2015-12-07 DIAGNOSIS — M542 Cervicalgia: Secondary | ICD-10-CM

## 2015-12-07 DIAGNOSIS — M5412 Radiculopathy, cervical region: Secondary | ICD-10-CM

## 2015-12-09 ENCOUNTER — Other Ambulatory Visit: Payer: Self-pay | Admitting: Family Medicine

## 2015-12-10 ENCOUNTER — Other Ambulatory Visit: Payer: Self-pay | Admitting: Family Medicine

## 2015-12-13 ENCOUNTER — Other Ambulatory Visit: Payer: Self-pay | Admitting: Family Medicine

## 2015-12-14 ENCOUNTER — Ambulatory Visit: Payer: Self-pay | Admitting: Family Medicine

## 2015-12-29 ENCOUNTER — Other Ambulatory Visit: Payer: Self-pay | Admitting: Family Medicine

## 2016-02-03 ENCOUNTER — Encounter: Payer: Self-pay | Admitting: Family Medicine

## 2016-02-08 ENCOUNTER — Ambulatory Visit (INDEPENDENT_AMBULATORY_CARE_PROVIDER_SITE_OTHER): Payer: Medicare Other | Admitting: Family Medicine

## 2016-02-08 ENCOUNTER — Encounter: Payer: Self-pay | Admitting: Family Medicine

## 2016-02-08 VITALS — BP 134/87 | HR 88 | Wt 174.0 lb

## 2016-02-08 DIAGNOSIS — M25561 Pain in right knee: Secondary | ICD-10-CM

## 2016-02-08 DIAGNOSIS — G8929 Other chronic pain: Secondary | ICD-10-CM | POA: Diagnosis not present

## 2016-02-08 DIAGNOSIS — M1711 Unilateral primary osteoarthritis, right knee: Secondary | ICD-10-CM | POA: Diagnosis not present

## 2016-02-08 DIAGNOSIS — R351 Nocturia: Secondary | ICD-10-CM

## 2016-02-08 DIAGNOSIS — N401 Enlarged prostate with lower urinary tract symptoms: Secondary | ICD-10-CM | POA: Diagnosis not present

## 2016-02-08 DIAGNOSIS — N400 Enlarged prostate without lower urinary tract symptoms: Secondary | ICD-10-CM | POA: Insufficient documentation

## 2016-02-08 MED ORDER — TRIAMCINOLONE ACETONIDE 0.1 % EX CREA: 1.0000 "application " | TOPICAL_CREAM | Freq: Two times a day (BID) | CUTANEOUS | 12 refills | Status: DC

## 2016-02-08 MED ORDER — DICLOFENAC SODIUM 1 % TD GEL: 4.0000 g | Freq: Four times a day (QID) | TRANSDERMAL | 11 refills | Status: DC

## 2016-02-08 MED ORDER — TAMSULOSIN HCL 0.4 MG PO CAPS: 0.4000 mg | ORAL_CAPSULE | Freq: Every day | ORAL | 1 refills | Status: DC

## 2016-02-08 NOTE — Patient Instructions (Signed)
Thank you for coming in today. START flomax for urinary symptoms.  USE voltaren gel for knee pain 4x daily.  Get labs today.  Return for recheck in 1 month.  Return also for back and neck pain on a separate date to discuss workers comp issues.   Benign Prostatic Hyperplasia An enlarged prostate (benign prostatic hyperplasia) is common in older men. You may experience the following:  Weak urine stream.  Dribbling.  Feeling like the bladder has not emptied completely.  Difficulty starting urination.  Getting up frequently at night to urinate.  Urinating more frequently during the day. HOME CARE INSTRUCTIONS  Monitor your prostatic hyperplasia for any changes. The following actions may help to alleviate any discomfort you are experiencing:  Give yourself time when you urinate.  Stay away from alcohol.  Avoid beverages containing caffeine, such as coffee, tea, and colas, because they can make the problem worse.  Avoid decongestants, antihistamines, and some prescription medicines that can make the problem worse.  Follow up with your health care provider for further treatment as recommended. SEEK MEDICAL CARE IF:  You are experiencing progressive difficulty voiding.  Your urine stream is progressively getting narrower.  You are awaking from sleep with the urge to void more frequently.  You are constantly feeling the need to void.  You experience loss of urine, especially in small amounts. SEEK IMMEDIATE MEDICAL CARE IF:   You develop increased pain with urination or are unable to urinate.  You develop severe abdominal pain, vomiting, a high fever, or fainting.  You develop back pain or blood in your urine. MAKE SURE YOU:   Understand these instructions.  Will watch your condition.  Will get help right away if you are not doing well or get worse. This information is not intended to replace advice given to you by your health care provider. Make sure you discuss any  questions you have with your health care provider. Document Released: 03/06/2005 Document Revised: 03/27/2014 Document Reviewed: 08/06/2012 Elsevier Interactive Patient Education  2017 [employer].

## 2016-02-08 NOTE — Progress Notes (Signed)
Jazmin Gradillas is a 65 y.o. male who presents to [facility] today for right knee pain and urinary symptoms.  Right knee pain: Patient is recently pain ongoing now for several months. He denies any injury. He does note some locking catching and occasional giving way. He's tried some over-the-counter medicines which have helped a bit. No fevers chills nausea vomiting or diarrhea.  Urinary symptoms: Patient has urinary frequency nocturia and urinary hesitancy. He denies any pelvic pain. He's never been evaluated for prostate problems.  AUASS:  Symptom Score: 22/35 (severe) Quality of life score: 3/6   Past Medical History:  Diagnosis Date  . Prediabetes    Past Surgical History:  Procedure Laterality Date  . UMBILICAL HERNIA REPAIR     Social History  Substance Use Topics  . Smoking status: Never Smoker  . Smokeless tobacco: Not on file  . Alcohol use 0.0 oz/week     Comment: Occasional   family history includes Hypertension in his father.  ROS as above:  Medications: Current Outpatient Prescriptions  Medication Sig Dispense Refill  . omeprazole (PRILOSEC) 40 MG capsule Take 40 mg by mouth daily.    . polyethylene glycol powder (GLYCOLAX/MIRALAX) powder Take 17 g by mouth daily. 850 g 1  . triamcinolone cream (KENALOG) 0.1 % Apply 1 application topically 2 (two) times daily. 454 g 12  . diclofenac sodium (VOLTAREN) 1 % GEL Apply 4 g topically 4 (four) times daily. To affected joint. 100 g 11  . tamsulosin (FLOMAX) 0.4 MG CAPS capsule Take 1 capsule (0.4 mg total) by mouth daily. 90 capsule 1   No current facility-administered medications for this visit.    No Known Allergies  Health Maintenance Health Maintenance  Topic Date Due  . Hepatitis C Screening  [birth year]  . HIV Screening  [birth year]1965  . TETANUS/TDAP  06/29/1969  . COLONOSCOPY  06/29/2000   . ZOSTAVAX  06/30/2010  . PNA vac Low Risk Adult (1 of 2 - PCV13) 06/30/2015  . INFLUENZA VACCINE  10/19/2015     Exam:  BP 134/87   Pulse 88   Wt 174 lb (78.9 kg)   BMI 26.46 kg/m  Gen: Well NAD HEENT: EOMI,  MMM Lungs: Normal work of breathing. CTABL Heart: RRR no MRG Abd: NABS, Soft. Nondistended, Nontender Exts: Brisk capillary refill, warm and well perfused.  Right knee: Normal-appearing without significant effusion. Normal range of motion. Mildly tender medial joint line. Positive medial McMurray's test. Negative Lachman's valgus and varus stress. Rectal exam: Normal-appearing anus. Prostate is large and boggy without nodules nontender.  X-ray right knee reviewed Study Result   CLINICAL DATA:  Report of remote injury. Complaining of right knee pain and swelling, particularly with excessive standing.  EXAM: RIGHT KNEE - COMPLETE 4+ VIEW  COMPARISON:  None.  FINDINGS: No fracture. No bone lesion. There is mild medial joint space compartment narrowing. Small marginal osteophytes are noted from all 3 compartments.  There is no joint effusion.  Soft tissues are unremarkable.  IMPRESSION: 1. No fracture or acute finding. 2. Mild osteoarthritis.   Electronically Signed   By: Lajean Manes M.D.   On: 01/20/2015 17:08      Procedure: Real-time Ultrasound Guided Injection of right knee  Device: GE Logiq E  Images permanently stored and available for review in the ultrasound unit. Verbal informed consent obtained. Discussed risks and benefits of procedure. Warned about infection bleeding damage to structures skin hypopigmentation  and fat atrophy among others. Patient expresses understanding and agreement Time-out conducted.  Noted no overlying erythema, induration, or other signs of local infection.  Skin prepped in a sterile fashion.  Local anesthesia: Topical Ethyl chloride.  With sterile technique and under real time ultrasound guidance:  80 mg of Kenalog and 4 mL of Marcaine injected easily.  Completed without difficulty  Pain immediately resolved suggesting accurate placement of the medication.  Advised to call if fevers/chills, erythema, induration, drainage, or persistent bleeding.  Images permanently stored and available for review in the ultrasound unit.  Impression: Technically successful ultrasound guided injection.     No results found for this or any previous visit (from the past 72 hour(s)). No results found.    Assessment and Plan: 65 y.o. male with  Right knee pain due to DJD. Injected today. Additionally we'll treat with diclofenac gel. Recheck in one month.  Urinary symptoms very likely due to BPH. Patient has a significantly positive AUA symptom score. Prostate is enlarged. Plan to check PSA and treat empirically with Flomax recheck in one month.    Orders Placed This Encounter  Procedures  . PSA    Discussed warning signs or symptoms. Please see discharge instructions. Patient expresses understanding.

## 2016-02-09 LAB — PSA: PSA: 0.4 ng/mL (ref <=4.0)

## 2016-02-14 ENCOUNTER — Telehealth: Payer: Self-pay | Admitting: *Deleted

## 2016-02-14 NOTE — Telephone Encounter (Signed)
Went ahead and did a PA for Diclofenac gel since patient does have a diagnosis of osteoarthritis. However no specific NSAID mentioned so insurance still probably won't cover it [facility] - PA Case ID: [id number]

## 2016-02-16 NOTE — Telephone Encounter (Signed)
Outcome  Approvedon November 27  PA Case [id number] is Approved. For further questions, call [phone].   Patient notified and pharmacy notified

## 2016-02-21 LAB — FECAL OCCULT BLOOD, GUAIAC: Fecal Occult Blood: NEGATIVE

## 2016-03-24 ENCOUNTER — Other Ambulatory Visit: Payer: Self-pay | Admitting: Family Medicine

## 2016-04-06 ENCOUNTER — Encounter: Payer: Self-pay | Admitting: Family Medicine

## 2016-04-06 NOTE — Progress Notes (Signed)
02/21/2016 

## 2016-04-19 DIAGNOSIS — H527 Unspecified disorder of refraction: Secondary | ICD-10-CM | POA: Diagnosis not present

## 2016-04-19 DIAGNOSIS — H524 Presbyopia: Secondary | ICD-10-CM | POA: Diagnosis not present

## 2016-04-27 ENCOUNTER — Encounter: Payer: Self-pay | Admitting: Family Medicine

## 2016-04-27 ENCOUNTER — Ambulatory Visit (INDEPENDENT_AMBULATORY_CARE_PROVIDER_SITE_OTHER): Payer: Medicare Other | Admitting: Family Medicine

## 2016-04-27 VITALS — BP 131/70 | HR 88 | Wt 180.0 lb

## 2016-04-27 DIAGNOSIS — R1013 Epigastric pain: Secondary | ICD-10-CM

## 2016-04-27 DIAGNOSIS — R1110 Vomiting, unspecified: Secondary | ICD-10-CM | POA: Diagnosis not present

## 2016-04-27 DIAGNOSIS — IMO0001 Reserved for inherently not codable concepts without codable children: Secondary | ICD-10-CM

## 2016-04-27 LAB — CBC
HEMATOCRIT: 41.6 % (ref 38.5–50.0)
Hemoglobin: 14.1 g/dL (ref 13.2–17.1)
MCH: 30.1 pg (ref 27.0–33.0)
MCHC: 33.9 g/dL (ref 32.0–36.0)
MCV: 88.7 fL (ref 80.0–100.0)
MPV: 9 fL (ref 7.5–12.5)
PLATELETS: 277 10*3/uL (ref 140–400)
RBC: 4.69 MIL/uL (ref 4.20–5.80)
RDW: 13.6 % (ref 11.0–15.0)
WBC: 4.4 10*3/uL (ref 3.8–10.8)

## 2016-04-27 NOTE — Progress Notes (Signed)
Michael Gallegos is a 66 y.o. male who presents to [facility] today for history of epigastric discomfort associated with bloating and occasional regurgitation. He takes omeprazole most days. He denies chest pain palpitations or shortness of breath. Symptoms are recurrent and bothersome. He denies significant dysphagia. He has a pertinent past surgical history for umbilical hernia repair. He had a noncontrast abdominal CT scan last year to evaluate for potential kidney stones.    Past Medical History:  Diagnosis Date  . Prediabetes    Past Surgical History:  Procedure Laterality Date  . UMBILICAL HERNIA REPAIR     Social History  Substance Use Topics  . Smoking status: Never Smoker  . Smokeless tobacco: Never Used  . Alcohol use 0.0 oz/week     Comment: Occasional   family history includes Hypertension in his father.  ROS as above:  Medications: Current Outpatient Prescriptions  Medication Sig Dispense Refill  . diclofenac sodium (VOLTAREN) 1 % GEL Apply 4 g topically 4 (four) times daily. To affected joint. 100 g 11  . naproxen (NAPROSYN) 375 MG tablet TAKE 1 TABLET (375 MG TOTAL) BY MOUTH 2 (TWO) TIMES DAILY WITH A MEAL. 60 tablet 2  . omeprazole (PRILOSEC) 40 MG capsule TAKE 1 CAPSULE (40 MG TOTAL) BY MOUTH DAILY. 30 capsule 10  . triamcinolone cream (KENALOG) 0.1 % Apply 1 application topically 2 (two) times daily. 454 g 12   No current facility-administered medications for this visit.    Allergies  Allergen Reactions  . Tamsulosin Hcl Nausea Only    Health Maintenance Health Maintenance  Topic Date Due  . Hepatitis C Screening  03-May-1950  . HIV Screening  06/29/1965  . TETANUS/TDAP  06/29/1969  . ZOSTAVAX  06/30/2010  . PNA vac Low Risk Adult (1 of 2 - PCV13) 06/30/2015  . INFLUENZA VACCINE  10/19/2015  . COLON CANCER SCREENING ANNUAL FOBT   02/20/2017     Exam:  BP 131/70   Pulse 88   Wt 180 lb (81.6 kg)   SpO2 99%   BMI 27.37 kg/m  Gen: Well NAD HEENT: EOMI,  MMM Lungs: Normal work of breathing. CTABL Heart: RRR no MRG Abd: NABS, Soft. Nondistended, Nontender Well mature scar around umbilicus  Exts: Brisk capillary refill, warm and well perfused.   Study Result   CLINICAL DATA:  Back pain radiating into the abdomen for 5-6 days. Hematuria. Initial encounter.  EXAM: CT ABDOMEN AND PELVIS WITHOUT CONTRAST  TECHNIQUE: Multidetector CT imaging of the abdomen and pelvis was performed following the standard protocol without IV contrast.  COMPARISON:  None.  FINDINGS: Small calcified pleural plaques are seen in the lower lung zones bilaterally. Emphysematous change in the lung bases is noted. No pleural or pericardial effusion.  The kidneys appear normal bilaterally. No renal or ureteral stones are seen on the right or left. No urinary bladder stones are identified. Prostate gland is unremarkable.  The gallbladder, spleen, adrenal glands and pancreas appear normal. A few small low attenuating lesions in the liver cannot be definitively characterized but are likely cysts. The stomach, small and large bowel and appendix appear normal. No lymphadenopathy or fluid. Small fat containing inguinal hernias are seen bilaterally.  No lytic or sclerotic bony lesion. Lower lumbar facet degenerative disease is noted.  IMPRESSION: Negative for urinary tract stone. No acute abnormality or finding to explain the patient's symptoms.  Small fat containing inguinal hernias bilaterally.  Small calcified pleural  plaques in the lower lung zones bilaterally likely due to prior asbestos exposure.   Electronically Signed   By: Inge Rise M.D.   On: 10/21/2015 14:31       No results found for this or any previous visit (from the past 72 hour(s)). No results found.    Assessment and Plan: 66  y.o. male with  Abdominal bloating regurgitation discomfort concerning hiatal hernia or gastritis. CBC CMP and lipase. Additionally obtain an abdominal ultrasound. I suspect patient will probably need an EGD. Refer to gastroenterology further workup and evaluation. Check in about a month.   Orders Placed This Encounter  Procedures  . US Abdomen Complete    Standing Status:   Future    Standing Expiration Date:   06/25/2017    Order Specific Question:   Reason for Exam (SYMPTOM  OR DIAGNOSIS REQUIRED)    Answer:   eval epigastric abd pain    Order Specific Question:   Preferred imaging location?    Answer:   [facility]  . CBC  . COMPLETE METABOLIC PANEL WITH GFR  . Lipase  . Ambulatory referral to Gastroenterology    Referral Priority:   Routine    Referral Type:   Consultation    Referral Reason:   Specialty Services Required    Requested Specialty:   Gastroenterology    Number of Visits Requested:   1   No orders of the defined types were placed in this encounter.    Discussed warning signs or symptoms. Please see discharge instructions. Patient expresses understanding.

## 2016-04-27 NOTE — Patient Instructions (Signed)
Thank you for coming in today. Get labs and Ultrasound.  Continue omeprazole.  Follow up with Gastroenterology.  Recheck in 1 month.    Hiatal Hernia A hiatal hernia occurs when part of your stomach slides above the muscle that separates your abdomen from your chest (diaphragm). You can be born with a hiatal hernia (congenital), or it may develop over time. In almost all cases of hiatal hernia, only the top part of the stomach pushes through.  Many people have a hiatal hernia with no symptoms. The larger the hernia, the more likely that you will have symptoms. In some cases, a hiatal hernia allows stomach acid to flow back into the tube that carries food from your mouth to your stomach (esophagus). This may cause heartburn symptoms. Severe heartburn symptoms may mean you have developed a condition called gastroesophageal reflux disease (GERD).  CAUSES  Hiatal hernias are caused by a weakness in the opening (hiatus) where your esophagus passes through your diaphragm to attach to the upper part of your stomach. You may be born with a weakness in your hiatus, or a weakness can develop. RISK FACTORS Older age is a major risk factor for a hiatal hernia. Anything that increases pressure on your diaphragm can also increase your risk of a hiatal hernia. This includes:  Pregnancy.  Excess weight.  Frequent constipation. SIGNS AND SYMPTOMS  People with a hiatal hernia often have no symptoms. If symptoms develop, they are almost always caused by GERD. They may include:  Heartburn.  Belching.  Indigestion.  Trouble swallowing.  Coughing or wheezing.  Sore throat.  Hoarseness.  Chest pain. DIAGNOSIS  A hiatal hernia is sometimes found during an exam for another problem. Your health care provider may suspect a hiatal hernia if you have symptoms of GERD. Tests may be done to diagnose GERD. These may include:  X-rays of your stomach or chest.  An upper gastrointestinal (GI) series. This  is an X-ray exam of your GI tract involving the use of a chalky liquid that you swallow. The liquid shows up clearly on the X-ray.  Endoscopy. This is a procedure to look into your stomach using a thin, flexible tube that has a tiny camera and light on the end of it. TREATMENT  If you have no symptoms, you may not need treatment. If you have symptoms, treatment may include:  Dietary and lifestyle changes to help reduce GERD symptoms.  Medicines. These may include:  Over-the-counter antacids.  Medicines that make your stomach empty more quickly.  Medicines that block the production of stomach acid (H2 blockers).  Stronger medicines to reduce stomach acid (proton pump inhibitors).  You may need surgery to repair the hernia if other treatments are not helping. HOME CARE INSTRUCTIONS   Take all medicines as directed by your health care provider.  Quit smoking, if you smoke.  Try to achieve and maintain a healthy body weight.  Eat frequent small meals instead of three large meals a day. This keeps your stomach from getting too full.  Eat slowly.  Do not lie down right after eating.  Do noteat 1-2 hours before bed.   Do not drink beverages with caffeine. These include cola, coffee, cocoa, and tea.  Do not drink alcohol.  Avoid foods that can make symptoms of GERD worse. These may include:  Fatty foods.  Citrus fruits.  Other foods and drinks that contain acid.  Avoid putting pressure on your belly. Anything that puts pressure on your belly increases the  amount of acid that may be pushed up into your esophagus.   Avoid bending over, especially after eating.  Raise the head of your bed by putting blocks under the legs. This keeps your head and esophagus higher than your stomach.  Do not wear tight clothing around your chest or stomach.  Try not to strain when having a bowel movement, when urinating, or when lifting heavy objects. SEEK MEDICAL CARE IF:  Your  symptoms are not controlled with medicines or lifestyle changes.  You are having trouble swallowing.  You have coughing or wheezing that will not go away. SEEK IMMEDIATE MEDICAL CARE IF:  Your pain is getting worse.  Your pain spreads to your arms, neck, jaw, teeth, or back.  You have shortness of breath.  You sweat for no reason.  You feel sick to your stomach (nauseous) or vomit.  You vomit blood.  You have bright red blood in your stools.  You have black, tarry stools.  This information is not intended to replace advice given to you by your health care provider. Make sure you discuss any questions you have with your health care provider. Document Released: 05/27/2003 Document Revised: 06/28/2015 Document Reviewed: 02/21/2013 Elsevier Interactive Patient Education  2017 [employer].

## 2016-04-28 LAB — COMPLETE METABOLIC PANEL WITH GFR
ALT: 16 U/L (ref 9–46)
AST: 17 U/L (ref 10–35)
Albumin: 4.2 g/dL (ref 3.6–5.1)
Alkaline Phosphatase: 42 U/L (ref 40–115)
BUN: 15 mg/dL (ref 7–25)
CALCIUM: 9.1 mg/dL (ref 8.6–10.3)
CHLORIDE: 104 mmol/L (ref 98–110)
CO2: 28 mmol/L (ref 20–31)
Creat: 1.12 mg/dL (ref 0.70–1.25)
GFR, EST AFRICAN AMERICAN: 79 mL/min (ref >=60)
GFR, EST NON AFRICAN AMERICAN: 69 mL/min (ref >=60)
Glucose, Bld: 101 mg/dL — ABNORMAL HIGH (ref 65–99)
POTASSIUM: 4.3 mmol/L (ref 3.5–5.3)
Sodium: 141 mmol/L (ref 135–146)
Total Bilirubin: 0.4 mg/dL (ref 0.2–1.2)
Total Protein: 6.6 g/dL (ref 6.1–8.1)

## 2016-04-28 LAB — LIPASE: LIPASE: 39 U/L (ref 7–60)

## 2016-05-02 ENCOUNTER — Ambulatory Visit (INDEPENDENT_AMBULATORY_CARE_PROVIDER_SITE_OTHER): Payer: Medicare Other

## 2016-05-02 DIAGNOSIS — R1013 Epigastric pain: Secondary | ICD-10-CM

## 2016-05-02 DIAGNOSIS — IMO0001 Reserved for inherently not codable concepts without codable children: Secondary | ICD-10-CM

## 2016-05-02 DIAGNOSIS — K7689 Other specified diseases of liver: Secondary | ICD-10-CM

## 2016-05-02 DIAGNOSIS — R1110 Vomiting, unspecified: Secondary | ICD-10-CM

## 2016-05-12 ENCOUNTER — Ambulatory Visit (INDEPENDENT_AMBULATORY_CARE_PROVIDER_SITE_OTHER): Payer: Medicare Other | Admitting: Family Medicine

## 2016-05-12 ENCOUNTER — Telehealth: Payer: Self-pay | Admitting: Family Medicine

## 2016-05-12 ENCOUNTER — Encounter: Payer: Self-pay | Admitting: Family Medicine

## 2016-05-12 VITALS — BP 119/70 | HR 84 | Ht 68.0 in | Wt 174.0 lb

## 2016-05-12 DIAGNOSIS — J301 Allergic rhinitis due to pollen: Secondary | ICD-10-CM

## 2016-05-12 DIAGNOSIS — R7303 Prediabetes: Secondary | ICD-10-CM

## 2016-05-12 DIAGNOSIS — H9313 Tinnitus, bilateral: Secondary | ICD-10-CM | POA: Diagnosis not present

## 2016-05-12 LAB — POCT GLYCOSYLATED HEMOGLOBIN (HGB A1C): Hemoglobin A1C: 6.4

## 2016-05-12 MED ORDER — FLUTICASONE PROPIONATE 50 MCG/ACT NA SUSP: 1.0000 | Freq: Every day | NASAL | 6 refills | Status: DC

## 2016-05-12 MED ORDER — PREDNISONE 20 MG PO TABS: 40.0000 mg | ORAL_TABLET | Freq: Every day | ORAL | 0 refills | Status: DC

## 2016-05-12 NOTE — Progress Notes (Signed)
Subjective:    Patient ID: Michael Gallegos, male    DOB: [birth year], 66 y.o.   MRN: [id number]  HPI 25 you with prediabetes comes into today complaining of tinnitus bilaterally. Describes it as a "swooshing" in his ears. It is not pulsatile. He says about sounds like when you turn your windows down in her car and you're driving fast. He denies any actual pain in the ears or drainage. Started about 3 days ago. No vertigo but has had in the past.   He has had some significant nasal congestion for a couple of weeks. He said though that that's chronic and comes and goes. No fevers chills or sweats or cough or sore throat. Is not currently taking any new medications.  IFG - no inc thirst or urination.   Review of Systems  BP 119/70   Pulse 84   Ht 5\' 8"  (1.727 m)   Wt 174 lb (78.9 kg)   SpO2 99%   BMI 26.46 kg/m     Allergies  Allergen Reactions  . Tamsulosin Hcl Nausea Only    Past Medical History:  Diagnosis Date  . Prediabetes     Past Surgical History:  Procedure Laterality Date  . UMBILICAL HERNIA REPAIR      Social History   Social History  . Marital status: Married    Spouse name: N/A  . Number of children: 7  . Years of education: N/A   Occupational History  . Not on file.   Social History Main Topics  . Smoking status: Never Smoker  . Smokeless tobacco: Never Used  . Alcohol use 0.0 oz/week     Comment: Occasional  . Drug use: No  . Sexual activity: Not on file   Other Topics Concern  . Not on file   Social History Narrative  . No narrative on file    Family History  Problem Relation Age of Onset  . Hypertension Father   . Heart disease Neg Hx     Outpatient Encounter Prescriptions as of 05/12/2016  Medication Sig  . diclofenac sodium (VOLTAREN) 1 % GEL Apply 4 g topically 4 (four) times daily. To affected joint.  . naproxen (NAPROSYN) 375 MG tablet TAKE 1 TABLET (375 MG TOTAL) BY MOUTH 2 (TWO) TIMES DAILY WITH A MEAL.  Marland Kitchen omeprazole (PRILOSEC)  40 MG capsule TAKE 1 CAPSULE (40 MG TOTAL) BY MOUTH DAILY.  Marland Kitchen triamcinolone cream (KENALOG) 0.1 % Apply 1 application topically 2 (two) times daily.   No facility-administered encounter medications on file as of 05/12/2016.        Objective:   Physical Exam  Constitutional: He is oriented to person, place, and time. He appears well-developed and well-nourished.  HENT:  Head: Normocephalic and atraumatic.  Right Ear: External ear normal.  Left Ear: External ear normal.  Nose: Nose normal.  Mouth/Throat: Oropharynx is clear and moist.  TMs and canals are clear.   Eyes: Conjunctivae and EOM are normal. Pupils are equal, round, and reactive to light.  Neck: Neck supple. No thyromegaly present.  Cardiovascular: Normal rate and normal heart sounds.   Pulmonary/Chest: Effort normal and breath sounds normal.  Lymphadenopathy:    He has no cervical adenopathy.  Neurological: He is alert and oriented to person, place, and time.  Skin: Skin is warm and dry.  Psychiatric: He has a normal mood and affect.       Assessment & Plan:  Tinnitus - I think his symptoms could be secondary to his  allergic rhinitis is also has some significant nasal congestion for the last couple of weeks. Medical head and put him on oral prednisone for 5 days as well as start fluticasone nasal spray.  AR- will treat with nasal steroid spray. If not improving or suddenly gets worse or if spikes a fever or gets increased facial pain or discomfort then consider treating for acute sinusitis.  IFG - A1c of 6.4 today which is borderline. He actually be a great candidate to consider starting metformin early. Will send a note try get him scheduled with his PCP in the next month to discuss this further.

## 2016-05-12 NOTE — Telephone Encounter (Signed)
Please call patient today and let him know that his A1c was 6.4. This is borderline for becoming fully diabetic. An A1c of 6.5 is what makes a diagnosis for diabetes. I would really like to come up with some strategies to help get this under better control before he becomes fully diabetic. Encouraged him to schedule with his primary care provider in the next month to do so. He sees Dr. Georgina Snell.  Beatrice Lecher, MD

## 2016-05-12 NOTE — Patient Instructions (Addendum)
Encourage your to schedule a Medicare Wellness Exam since you are 65 now.   If your ear ringing isn't improving them let us know and we can get you in with and Ear, Nose and Throat specialist.

## 2016-05-15 NOTE — Telephone Encounter (Signed)
Left detailed vm with recommendations. Requested a call back with concerns.  

## 2016-05-18 DIAGNOSIS — Z1211 Encounter for screening for malignant neoplasm of colon: Secondary | ICD-10-CM | POA: Diagnosis not present

## 2016-05-18 DIAGNOSIS — K219 Gastro-esophageal reflux disease without esophagitis: Secondary | ICD-10-CM | POA: Diagnosis not present

## 2016-05-18 DIAGNOSIS — R1310 Dysphagia, unspecified: Secondary | ICD-10-CM | POA: Diagnosis not present

## 2016-05-23 ENCOUNTER — Telehealth: Payer: Self-pay

## 2016-05-23 DIAGNOSIS — H9209 Otalgia, unspecified ear: Secondary | ICD-10-CM

## 2016-05-23 NOTE — Telephone Encounter (Signed)
Pt called requesting a referral to ENT for ear pain.

## 2016-05-24 ENCOUNTER — Ambulatory Visit (INDEPENDENT_AMBULATORY_CARE_PROVIDER_SITE_OTHER): Payer: Medicare Other | Admitting: Family Medicine

## 2016-05-24 ENCOUNTER — Encounter: Payer: Self-pay | Admitting: Family Medicine

## 2016-05-24 VITALS — BP 126/79 | HR 83 | Wt 176.0 lb

## 2016-05-24 DIAGNOSIS — R7303 Prediabetes: Secondary | ICD-10-CM

## 2016-05-24 DIAGNOSIS — H9312 Tinnitus, left ear: Secondary | ICD-10-CM | POA: Diagnosis not present

## 2016-05-24 MED ORDER — METFORMIN HCL ER (MOD) 1000 MG PO TB24: 1000.0000 mg | ORAL_TABLET | Freq: Every day | ORAL | 1 refills | Status: DC

## 2016-05-24 MED ORDER — AMBULATORY NON FORMULARY MEDICATION: 0 refills | Status: DC

## 2016-05-24 NOTE — Telephone Encounter (Signed)
Pt seen in clinic to discuss this matter.

## 2016-05-24 NOTE — Telephone Encounter (Signed)
Referral placed.

## 2016-05-24 NOTE — Progress Notes (Signed)
Michael Gallegos is a 66 y.o. male who presents to [facility] today for ringing and left ear. Patient notes a roaring sensation or a ringing sensation in his left ear present for a few weeks now.  This is occasionally associated with vertigo. He notes he has a history of occupational exposure to loud noises working in [employer] as well as history of head trauma. 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 He notes symptoms started recently. He was seen by my partner in this practice to prescribe prednisone which did not help.  Prediabetes: At that visit were patient was diagnosed with tinnitus a few weeks ago he was found to have an elevated hemoglobin A1c of 6.4. He denies polyuria or polydipsia. Occasionally he feels lightheadedness and dizziness and suspects low blood sugar.   Past Medical History:  Diagnosis Date  . Prediabetes    Past Surgical History:  Procedure Laterality Date  . UMBILICAL HERNIA REPAIR     Social History  Substance Use Topics  . Smoking status: Never Smoker  . Smokeless tobacco: Never Used  . Alcohol use 0.0 oz/week     Comment: Occasional   family history includes Hypertension in his father.  ROS as above:  Medications: Current Outpatient Prescriptions  Medication Sig Dispense Refill  . diclofenac sodium (VOLTAREN) 1 % GEL Apply 4 g topically 4 (four) times daily. To affected joint. 100 g 11  . fluticasone (FLONASE) 50 MCG/ACT nasal spray Place 1-2 sprays into both nostrils daily. 16 g 6  . naproxen (NAPROSYN) 375 MG tablet TAKE 1 TABLET (375 MG TOTAL) BY MOUTH 2 (TWO) TIMES DAILY WITH A MEAL. 60 tablet 2  . omeprazole (PRILOSEC) 40 MG capsule TAKE 1 CAPSULE (40 MG TOTAL) BY MOUTH DAILY. 30 capsule 10  . triamcinolone cream (KENALOG) 0.1 % Apply 1 application topically 2  (two) times daily. 454 g 12  . AMBULATORY NON FORMULARY MEDICATION Single glucometer with lancets, test strips. Test daily . Prediabetes 1 each 0  . metFORMIN (GLUMETZA) 1000 MG (MOD) 24 hr tablet Take 1 tablet (1,000 mg total) by mouth daily with breakfast. 90 tablet 1   No current facility-administered medications for this visit.    Allergies  Allergen Reactions  . Tamsulosin Hcl Nausea Only    Health Maintenance Health Maintenance  Topic Date Due  . Hepatitis C Screening  [birth year]  . HIV Screening  06/29/1965  . PNA vac Low Risk Adult (1 of 2 - PCV13) 05/12/2017 (Originally 06/30/2015)  . INFLUENZA VACCINE  05/12/2018 (Originally 10/19/2015)  . TETANUS/TDAP  05/12/2021 (Originally 06/29/1969)  . COLON CANCER SCREENING ANNUAL FOBT  02/20/2017     Exam:  BP 126/79   Pulse 83   Wt 176 lb (79.8 kg)   BMI 26.76 kg/m  Gen: Well NAD HEENT: EOMI,  MMM tympanic membranes are unremarkable appearing bilaterally. Lungs: Normal work of breathing. CTABL Heart: RRR no MRG Abd: NABS, Soft. Nondistended, Nontender Exts: Brisk capillary refill, warm and well perfused.  Neuro alert and oriented normal balance and coordination.  Lab Results  Component Value Date   HGBA1C 6.4 05/12/2016  \    No results found for this or any previous visit (from the past 72 hour(s)). No results found.    Assessment and Plan: 66 y.o. male with  Tinnitus left ear associated with vertigo. 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 Mnire's disease is a possibility with vertigo however. Refer to ENT for further evaluation and potential management. Discussed lifestyle modification such as noise machine at night as well as protecting hearing in the future.  Prediabetes: Discussed options. Plan to start extended-release metformin. Recheck in 1-2 months. Prescribed glucometer for glucose check in with potential hypoglycemic episodes.   Orders Placed This Encounter  Procedures  .  Ambulatory referral to ENT    Referral Priority:   Routine    Referral Type:   Consultation    Referral Reason:   Specialty Services Required    Requested Specialty:   Otolaryngology    Number of Visits Requested:   1   Meds ordered this encounter  Medications  . metFORMIN (GLUMETZA) 1000 MG (MOD) 24 hr tablet    Sig: Take 1 tablet (1,000 mg total) by mouth daily with breakfast.    Dispense:  90 tablet    Refill:  1  . AMBULATORY NON FORMULARY MEDICATION    Sig: Single glucometer with lancets, test strips. Test daily . Prediabetes    Dispense:  1 each    Refill:  0     Discussed warning signs or symptoms. Please see discharge instructions. Patient expresses understanding.

## 2016-05-24 NOTE — Patient Instructions (Signed)
Thank you for coming in today. You should hear from the ear doctor about the tinnitus soon. Start metformin daily for prediabetes.  Recheck in 1-2 months.  Tinnitus Tinnitus refers to hearing a sound when there is no actual source for that sound. This is often described as ringing in the ears. However, people with this condition may hear a variety of noises. A person may hear the sound in one ear or in both ears. The sounds of tinnitus can be soft, loud, or somewhere in between. Tinnitus can last for a few seconds or can be constant for days. It may go away without treatment and come back at various times. When tinnitus is constant or happens often, it can lead to other problems, such as trouble sleeping and trouble concentrating. Almost everyone experiences tinnitus at some point. Tinnitus that is long-lasting (chronic) or comes back often is a problem that may require medical attention. What are the causes? The cause of tinnitus is often not known. In some cases, it can result from other problems or conditions, including:  Exposure to loud noises from machinery, music, or other sources.  Hearing loss.  Ear or sinus infections.  Earwax buildup.  A foreign object in the ear.  Use of certain medicines.  Use of alcohol and caffeine.  High blood pressure.  Heart diseases.  Anemia.  Allergies.  Meniere disease.  Thyroid problems.  Tumors.  An enlarged part of a weakened blood vessel (aneurysm). What are the signs or symptoms? The main symptom of tinnitus is hearing a sound when there is no source for that sound. It may sound like:  Buzzing.  Roaring.  Ringing.  Blowing air, similar to the sound heard when you listen to a seashell.  Hissing.  Whistling.  Sizzling.  Humming.  Running water.  A sustained musical note. How is this diagnosed? Tinnitus is diagnosed based on your symptoms. Your health care provider will do a physical exam. A comprehensive hearing  exam (audiologic exam) will be done if your tinnitus:  Affects only one ear (unilateral).  Causes hearing difficulties.  Lasts 6 months or longer. You may also need to see a health care provider who specializes in hearing disorders (audiologist). You may be asked to complete a questionnaire to determine the severity of your tinnitus. Tests may be done to help determine the cause and to rule out other conditions. These can include:  Imaging studies of your head and brain, such as:  A CT scan.  An MRI.  An imaging study of your blood vessels (angiogram). How is this treated? Treating an underlying medical condition can sometimes make tinnitus go away. If your tinnitus continues, other treatments may include:  Medicines, such as certain antidepressants or sleeping aids.  Sound generators to mask the tinnitus. These include:  Tabletop sound machines that play relaxing sounds to help you fall asleep.  Wearable devices that fit in your ear and play sounds or music.  A small device that uses headphones to deliver a signal embedded in music (acoustic neural stimulation). In time, this may change the pathways of your brain and make you less sensitive to tinnitus. This device is used for very severe cases when no other treatment is working.  Therapy and counseling to help you manage the stress of living with tinnitus.  Using hearing aids or cochlear implants, if your tinnitus is related to hearing loss. Follow these instructions at home:  When possible, avoid being in loud places and being exposed to loud  sounds.  Wear hearing protection, such as earplugs, when you are exposed to loud noises.  Do not take stimulants, such as nicotine, alcohol, or caffeine.  Practice techniques for reducing stress, such as meditation, yoga, or deep breathing.  Use a white noise machine, a humidifier, or other devices to mask the sound of tinnitus.  Sleep with your head slightly raised. This may  reduce the impact of tinnitus.  Try to get plenty of rest each night. Contact a health care provider if:  You have tinnitus in just one ear.  Your tinnitus continues for 3 weeks or longer without stopping.  Home care measures are not helping.  You have tinnitus after a head injury.  You have tinnitus along with any of the following:  Dizziness.  Loss of balance.  Nausea and vomiting. This information is not intended to replace advice given to you by your health care provider. Make sure you discuss any questions you have with your health care provider. Document Released: 03/06/2005 Document Revised: 11/07/2015 Document Reviewed: 08/06/2013 Elsevier Interactive Patient Education  2017 [employer].  Metformin extended-release tablets What is this medicine? METFORMIN (met FOR min) is used to treat type 2 diabetes. It helps to control blood sugar. Treatment is combined with diet and exercise. This medicine can be used alone or with other medicines for diabetes. This medicine may be used for other purposes; ask your health care provider or pharmacist if you have questions. COMMON BRAND NAME(S): Fortamet, Glucophage XR, Glumetza What should I tell my health care provider before I take this medicine? They need to know if you have any of these conditions: -anemia -dehydration -heart disease -frequently drink alcohol-containing beverages -kidney disease -liver disease -polycystic ovary syndrome -serious infection or injury -vomiting -an unusual or allergic reaction to metformin, other medicines, foods, dyes, or preservatives -pregnant or trying to get pregnant -breast-feeding How should I use this medicine? Take this medicine by mouth with a glass of water. Follow the directions on the prescription label. Take this medicine with food. Take your medicine at regular intervals. Do not take your medicine more often than directed. Do not stop taking except on your doctor's  advice. Talk to your pediatrician regarding the use of this medicine in children. Special care may be needed. Overdosage: If you think you have taken too much of this medicine contact a poison control center or emergency room at once. NOTE: This medicine is only for you. Do not share this medicine with others. What if I miss a dose? If you miss a dose, take it as soon as you can. If it is almost time for your next dose, take only that dose. Do not take double or extra doses. What may interact with this medicine? Do not take this medicine with any of the following medications: -dofetilide -certain contrast medicines given before X-rays, CT scans, MRI, or other procedures This medicine may also interact with the following medications: -acetazolamide -certain antiviral medicines for HIV or AIDS or for hepatitis, like adefovir, dolutegravir, emtricitabine, entecavir, lamivudine, paritaprevir, or tenofovir -cimetidine -cobicistat -crizotinib -dichlorphenamide -digoxin -diuretics -male hormones, like estrogens or progestins and birth control pills -glycopyrrolate -isoniazid -lamotrigine -medicines for blood pressure, heart disease, irregular heart beat -memantine -midodrine -methazolamide -morphine -niacin -phenothiazines like chlorpromazine, mesoridazine, prochlorperazine, thioridazine -phenytoin -procainamide -propantheline -quinidine -quinine -ranitidine -ranolazine -steroid medicines like prednisone or cortisone -stimulant medicines for attention disorders, weight loss, or to stay awake -thyroid medicines -topiramate -trimethoprim -trospium -vancomycin -vandetanib -zonisamide This list may not describe all  possible interactions. Give your health care provider a list of all the medicines, herbs, non-prescription drugs, or dietary supplements you use. Also tell them if you smoke, drink alcohol, or use illegal drugs. Some items may interact with your medicine. What should  I watch for while using this medicine? Visit your doctor or health care professional for regular checks on your progress. A test called the HbA1C (A1C) will be monitored. This is a simple blood test. It measures your blood sugar control over the last 2 to 3 months. You will receive this test every 3 to 6 months. Learn how to check your blood sugar. Learn the symptoms of low and high blood sugar and how to manage them. Always carry a quick-source of sugar with you in case you have symptoms of low blood sugar. Examples include hard sugar candy or glucose tablets. Make sure others know that you can choke if you eat or drink when you develop serious symptoms of low blood sugar, such as seizures or unconsciousness. They must get medical help at once. Tell your doctor or health care professional if you have high blood sugar. You might need to change the dose of your medicine. If you are sick or exercising more than usual, you might need to change the dose of your medicine. Do not skip meals. Ask your doctor or health care professional if you should avoid alcohol. Many nonprescription cough and cold products contain sugar or alcohol. These can affect blood sugar. This medicine may cause ovulation in premenopausal women who do not have regular monthly periods. This may increase your chances of becoming pregnant. You should not take this medicine if you become pregnant or think you may be pregnant. Talk with your doctor or health care professional about your birth control options while taking this medicine. Contact your doctor or health care professional right away if think you are pregnant. The tablet shell for some brands of this medicine does not dissolve. This is normal. The tablet shell may appear whole in the stool. This is not a cause for concern. If you are going to need surgery, a MRI, CT scan, or other procedure, tell your doctor that you are taking this medicine. You may need to stop taking this medicine  before the procedure. Wear a medical ID bracelet or chain, and carry a card that describes your disease and details of your medicine and dosage times. What side effects may I notice from receiving this medicine? Side effects that you should report to your doctor or health care professional as soon as possible: -allergic reactions like skin rash, itching or hives, swelling of the face, lips, or tongue -breathing problems -feeling faint or lightheaded, falls -muscle aches or pains -signs and symptoms of low blood sugar such as feeling anxious, confusion, dizziness, increased hunger, unusually weak or tired, sweating, shakiness, cold, irritable, headache, blurred vision, fast heartbeat, loss of consciousness -slow or irregular heartbeat -unusual stomach pain or discomfort -unusually tired or weak Side effects that usually do not require medical attention (report to your doctor or health care professional if they continue or are bothersome): -diarrhea -headache -heartburn -metallic taste in mouth -nausea -stomach gas, upset This list may not describe all possible side effects. Call your doctor for medical advice about side effects. You may report side effects to FDA at 1-800-FDA-1088. Where should I keep my medicine? Keep out of the reach of children. Store at room temperature between 15 and 30 degrees C (59 and 86 degrees F). Protect  from light. Throw away any unused medicine after the expiration date. NOTE: This sheet is a summary. It may not cover all possible information. If you have questions about this medicine, talk to your doctor, pharmacist, or health care provider.  2018 Elsevier/Gold Standard (2015-09-15 15:47:35)

## 2016-06-06 DIAGNOSIS — H9042 Sensorineural hearing loss, unilateral, left ear, with unrestricted hearing on the contralateral side: Secondary | ICD-10-CM | POA: Diagnosis not present

## 2016-08-06 ENCOUNTER — Other Ambulatory Visit: Payer: Self-pay | Admitting: Family Medicine

## 2016-10-04 ENCOUNTER — Encounter: Payer: Self-pay | Admitting: Family Medicine

## 2016-10-04 ENCOUNTER — Ambulatory Visit (INDEPENDENT_AMBULATORY_CARE_PROVIDER_SITE_OTHER): Payer: Medicare Other | Admitting: Family Medicine

## 2016-10-04 VITALS — BP 109/70 | HR 80 | Ht 68.0 in | Wt 171.0 lb

## 2016-10-04 DIAGNOSIS — N410 Acute prostatitis: Secondary | ICD-10-CM | POA: Diagnosis not present

## 2016-10-04 DIAGNOSIS — R350 Frequency of micturition: Secondary | ICD-10-CM

## 2016-10-04 MED ORDER — TAMSULOSIN HCL 0.4 MG PO CAPS: 0.4000 mg | ORAL_CAPSULE | Freq: Every day | ORAL | 3 refills | Status: DC

## 2016-10-04 MED ORDER — CIPROFLOXACIN HCL 500 MG PO TABS: 500.0000 mg | ORAL_TABLET | Freq: Two times a day (BID) | ORAL | 0 refills | Status: DC

## 2016-10-04 NOTE — Patient Instructions (Addendum)
Thank you for coming in today. We will do the urine culture.  Continue Tamsulosin for prostate.  Start Cipro twice daily for 2 weeks for prostate infection.  Return if not better.    Prostatitis Prostatitis is swelling of the prostate gland. The prostate helps to make semen. It is below a man's bladder, in front of the rectum. There are different types of prostatitis. Follow these instructions at home:  Take over-the-counter and prescription medicines only as told by your doctor.  If you were prescribed an antibiotic medicine, take it as told by your doctor. Do not stop taking the antibiotic even if you start to feel better.  If your doctor prescribed exercises, do them as directed.  Take sitz baths as told by your doctor. To take a sitz bath, sit in warm water that is deep enough to cover your hips and butt.  Keep all follow-up visits as told by your doctor. This is important. Contact a doctor if:  Your symptoms get worse.  You have a fever. Get help right away if:  You have chills.  You feel sick to your stomach (nauseous).  You throw up (vomit).  You feel light-headed.  You feel like you might pass out (faint).  You cannot pee (urinate).  You have blood or clumps of blood (blood clots) in your pee (urine). This information is not intended to replace advice given to you by your health care provider. Make sure you discuss any questions you have with your health care provider. Document Released: 09/05/2011 Document Revised: 11/25/2015 Document Reviewed: 11/25/2015 Elsevier Interactive Patient Education  2017 [employer].    Varicocele A varicocele is a swelling of veins in the scrotum. The scrotum is the sac that contains the testicles. Varicoceles can occur on either side of the scrotum, but they are more common on the left side. They occur most often in teenage boys and young men. In most cases, varicoceles are not a serious problem. They are usually small and  painless and do not require treatment. Tests may be done to confirm the diagnosis. Treatment may be needed if:  A varicocele is large, causes a lot of pain, or causes pain when exercising.  Varicoceles are found on both sides of the scrotum.  The testicle on the opposite side is absent or not normal.  A varicocele causes a decrease in the size of the testicle in a growing adolescent.  The person has fertility problems.  What are the causes? This condition is the result of valves in the veins not working properly. Valves in the veins help to return blood from the scrotum and testicles to the heart. If these valves do not work well, blood flows backward and backs up into the veins, which causes the veins to swell. This is similar to what happens when varicose veins form in the leg. What are the signs or symptoms? Most varicoceles do not cause any symptoms. If symptoms do occur, they may include:  Swelling on one side of the scrotum. The swelling may be more obvious when you are standing up.  A lumpy feeling in the scrotum.  A heavy feeling on one side of the scrotum.  A dull ache in the scrotum, especially after exercise or prolonged standing or sitting.  Slower growth or reduced size of the testicle on the side of the varicocele (in young males).  Problems with fertility. These can occur if the testicle does not grow normally.  How is this diagnosed? This condition  may be diagnosed with a physical exam. You may also have an imaging test, called an ultrasound, to confirm the diagnosis and to help rule out other causes of the swelling. How is this treated? Treatment is usually not needed for this condition. If you have any pain, your health care provider may prescribe or recommend medicine to help relieve it. You may need regular exams so your health care provider can monitor the varicocele to ensure that it does not cause problems. When further treatment is needed, it may involve one of  these options:  Varicocelectomy. This is a surgery in which the swollen veins are tied off so that the flow of blood goes to other veins instead.  Embolization. In this procedure, a small tube (catheter) is used to place metal coils or other blocking items in the veins. This cuts off the blood flow to the swollen veins.  Follow these instructions at home:  Take medicines only as directed by your health care provider.  Wear supportive underwear.  Use an athletic supporter for sports.  Keep all follow-up visits as directed by your health care provider. This is important. Contact a health care provider if:  Your pain is increasing.  You have redness in the affected area.  You have swelling that does not decrease when you are lying down.  One of your testicles is smaller than the other.  Your testicle becomes enlarged, swollen, or painful. This information is not intended to replace advice given to you by your health care provider. Make sure you discuss any questions you have with your health care provider. Document Released: 06/12/2000 Document Revised: 08/18/2015 Document Reviewed: 02/11/2014 Elsevier Interactive Patient Education  [employer].

## 2016-10-04 NOTE — Progress Notes (Signed)
Michael Gallegos is a 66 y.o. male who presents to [facility] today for back pain. Patient notes pelvic soreness and back pain present for a few days associated with increased urinary frequency. He has a history of BPH. He's been taking Flomax which does help. He denies fevers or chills nausea vomiting or diarrhea. He's had his blood sugar checked and it was normal yesterday.   Past Medical History:  Diagnosis Date  . Prediabetes    Past Surgical History:  Procedure Laterality Date  . UMBILICAL HERNIA REPAIR     Social History  Substance Use Topics  . Smoking status: Never Smoker  . Smokeless tobacco: Never Used  . Alcohol use 0.0 oz/week     Comment: Occasional   family history includes Hypertension in his father.  ROS as above:  Medications: Current Outpatient Prescriptions  Medication Sig Dispense Refill  . AMBULATORY NON FORMULARY MEDICATION Single glucometer with lancets, test strips. Test daily . Prediabetes 1 each 0  . diclofenac sodium (VOLTAREN) 1 % GEL Apply 4 g topically 4 (four) times daily. To affected joint. 100 g 11  . fluticasone (FLONASE) 50 MCG/ACT nasal spray Place 1-2 sprays into both nostrils daily. 16 g 6  . naproxen (NAPROSYN) 375 MG tablet TAKE 1 TABLET (375 MG TOTAL) BY MOUTH 2 (TWO) TIMES DAILY WITH A MEAL. 60 tablet 2  . omeprazole (PRILOSEC) 40 MG capsule TAKE 1 CAPSULE (40 MG TOTAL) BY MOUTH DAILY. 30 capsule 10  . triamcinolone cream (KENALOG) 0.1 % Apply 1 application topically 2 (two) times daily. 454 g 12  . ciprofloxacin (CIPRO) 500 MG tablet Take 1 tablet (500 mg total) by mouth 2 (two) times daily. 28 tablet 0  . tamsulosin (FLOMAX) 0.4 MG CAPS capsule Take 1 capsule (0.4 mg total) by mouth daily after breakfast. 90 capsule 3   No current facility-administered medications for this visit.    No Active Allergies  Health  Maintenance Health Maintenance  Topic Date Due  . Hepatitis C Screening  06/06/1950  . PNA vac Low Risk Adult (1 of 2 - PCV13) 05/12/2017 (Originally 06/30/2015)  . INFLUENZA VACCINE  05/12/2018 (Originally 10/18/2016)  . TETANUS/TDAP  05/12/2021 (Originally [birth year]1969)  . COLON CANCER SCREENING ANNUAL FOBT  02/20/2017     Exam:  BP 109/70   Pulse 80   Ht 5\' 8"  (1.727 m)   Wt 171 lb (77.6 kg)   BMI 26.00 kg/m  Gen: Well NAD HEENT: EOMI,  MMM Lungs: Normal work of breathing. CTABL Heart: RRR no MRG Abd: NABS, Soft. Nondistended, Nontender no CVA tenderness to percussion Exts: Brisk capillary refill, warm and well perfused.  MSK: Nontender along the lumbar spinal midline and paraspinal muscles. Normal spine motion   No results found for this or any previous visit (from the past 72 hour(s)). No results found.    Assessment and Plan: 66 y.o. male with  Diffuse pelvic soreness with back pain and urinary frequency. This is concerning for prostatitis based on his history of BPH. Plan to continue Flomax and treat with Cipro. Urine culture pending. Doubtful for uncontrolled diabetes based on normal blood sugar value yesterday. Return if not better.   Orders Placed This Encounter  Procedures  . Urine Culture   Meds ordered this encounter  Medications  . tamsulosin (FLOMAX) 0.4 MG CAPS capsule    Sig: Take 1 capsule (0.4 mg total) by mouth daily after breakfast.    Dispense:  90 capsule    Refill:  3  . ciprofloxacin (CIPRO) 500 MG tablet    Sig: Take 1 tablet (500 mg total) by mouth 2 (two) times daily.    Dispense:  28 tablet    Refill:  0     Discussed warning signs or symptoms. Please see discharge instructions. Patient expresses understanding.

## 2016-10-05 LAB — URINE CULTURE: Organism ID, Bacteria: NO GROWTH

## 2016-12-13 DIAGNOSIS — Z77090 Contact with and (suspected) exposure to asbestos: Secondary | ICD-10-CM | POA: Diagnosis not present

## 2016-12-13 DIAGNOSIS — R918 Other nonspecific abnormal finding of lung field: Secondary | ICD-10-CM | POA: Diagnosis not present

## 2016-12-13 DIAGNOSIS — R05 Cough: Secondary | ICD-10-CM | POA: Diagnosis not present

## 2016-12-13 DIAGNOSIS — J309 Allergic rhinitis, unspecified: Secondary | ICD-10-CM | POA: Diagnosis not present

## 2016-12-18 ENCOUNTER — Telehealth: Payer: Self-pay | Admitting: Family Medicine

## 2016-12-18 DIAGNOSIS — R351 Nocturia: Principal | ICD-10-CM

## 2016-12-18 DIAGNOSIS — N401 Enlarged prostate with lower urinary tract symptoms: Secondary | ICD-10-CM

## 2016-12-18 NOTE — Telephone Encounter (Signed)
Pt called.  Prostate pain and swelling-wants to be referred to a Urologist .

## 2016-12-19 NOTE — Telephone Encounter (Signed)
Thank you.  Pt aware.

## 2016-12-19 NOTE — Telephone Encounter (Signed)
Referral sent 

## 2016-12-20 DIAGNOSIS — J929 Pleural plaque without asbestos: Secondary | ICD-10-CM | POA: Diagnosis not present

## 2016-12-20 DIAGNOSIS — R918 Other nonspecific abnormal finding of lung field: Secondary | ICD-10-CM | POA: Diagnosis not present

## 2017-01-08 DIAGNOSIS — D120 Benign neoplasm of cecum: Secondary | ICD-10-CM | POA: Diagnosis not present

## 2017-01-08 DIAGNOSIS — Z1211 Encounter for screening for malignant neoplasm of colon: Secondary | ICD-10-CM | POA: Diagnosis not present

## 2017-01-08 LAB — HM COLONOSCOPY

## 2017-02-05 DIAGNOSIS — N401 Enlarged prostate with lower urinary tract symptoms: Secondary | ICD-10-CM | POA: Diagnosis not present

## 2017-02-05 DIAGNOSIS — R350 Frequency of micturition: Secondary | ICD-10-CM | POA: Diagnosis not present

## 2017-02-27 ENCOUNTER — Other Ambulatory Visit: Payer: Self-pay

## 2017-02-27 MED ORDER — OMEPRAZOLE 40 MG PO CPDR: 40.0000 mg | DELAYED_RELEASE_CAPSULE | Freq: Every day | ORAL | 0 refills | Status: DC

## 2017-04-10 ENCOUNTER — Other Ambulatory Visit: Payer: Self-pay | Admitting: Family Medicine

## 2017-04-17 ENCOUNTER — Other Ambulatory Visit: Payer: Self-pay

## 2017-04-17 MED ORDER — DICLOFENAC SODIUM 1 % TD GEL: TRANSDERMAL | 0 refills | Status: DC

## 2017-04-17 MED ORDER — TRIAMCINOLONE ACETONIDE 0.1 % EX CREA: 1.0000 "application " | TOPICAL_CREAM | Freq: Two times a day (BID) | CUTANEOUS | 0 refills | Status: DC

## 2017-04-17 MED ORDER — FLUTICASONE PROPIONATE 50 MCG/ACT NA SUSP: 1.0000 | Freq: Every day | NASAL | 0 refills | Status: DC

## 2017-04-17 MED ORDER — NAPROXEN 375 MG PO TABS: 375.0000 mg | ORAL_TABLET | Freq: Two times a day (BID) | ORAL | 0 refills | Status: DC

## 2017-04-17 MED ORDER — OMEPRAZOLE 40 MG PO CPDR: 40.0000 mg | DELAYED_RELEASE_CAPSULE | Freq: Every day | ORAL | 0 refills | Status: DC

## 2017-04-17 MED ORDER — TAMSULOSIN HCL 0.4 MG PO CAPS: 0.4000 mg | ORAL_CAPSULE | Freq: Every day | ORAL | 0 refills | Status: DC

## 2017-04-26 ENCOUNTER — Telehealth: Payer: Self-pay | Admitting: Family Medicine

## 2017-04-26 MED ORDER — TAMSULOSIN HCL 0.4 MG PO CAPS: 0.4000 mg | ORAL_CAPSULE | Freq: Every day | ORAL | 1 refills | Status: DC

## 2017-04-26 MED ORDER — TRIAMCINOLONE ACETONIDE 0.1 % EX CREA
1.0000 | TOPICAL_CREAM | Freq: Two times a day (BID) | CUTANEOUS | 12 refills | Status: DC
Start: 2017-04-26 — End: 2018-04-18

## 2017-04-26 MED ORDER — OMEPRAZOLE 40 MG PO CPDR: 40.0000 mg | DELAYED_RELEASE_CAPSULE | Freq: Every day | ORAL | 1 refills | Status: DC

## 2017-04-26 NOTE — Telephone Encounter (Signed)
Switched to [employer]

## 2017-05-29 DIAGNOSIS — E109 Type 1 diabetes mellitus without complications: Secondary | ICD-10-CM | POA: Diagnosis not present

## 2017-05-29 DIAGNOSIS — Z0100 Encounter for examination of eyes and vision without abnormal findings: Secondary | ICD-10-CM | POA: Diagnosis not present

## 2017-06-14 DIAGNOSIS — R350 Frequency of micturition: Secondary | ICD-10-CM | POA: Diagnosis not present

## 2017-06-14 DIAGNOSIS — N401 Enlarged prostate with lower urinary tract symptoms: Secondary | ICD-10-CM | POA: Diagnosis not present

## 2017-06-14 DIAGNOSIS — N433 Hydrocele, unspecified: Secondary | ICD-10-CM | POA: Diagnosis not present

## 2017-06-14 DIAGNOSIS — R351 Nocturia: Secondary | ICD-10-CM | POA: Diagnosis not present

## 2017-06-22 ENCOUNTER — Ambulatory Visit (INDEPENDENT_AMBULATORY_CARE_PROVIDER_SITE_OTHER): Payer: Medicare HMO | Admitting: Family Medicine

## 2017-06-22 ENCOUNTER — Encounter: Payer: Self-pay | Admitting: Family Medicine

## 2017-06-22 VITALS — BP 124/78 | HR 67 | Wt 176.0 lb

## 2017-06-22 DIAGNOSIS — G459 Transient cerebral ischemic attack, unspecified: Secondary | ICD-10-CM

## 2017-06-22 DIAGNOSIS — Z1159 Encounter for screening for other viral diseases: Secondary | ICD-10-CM

## 2017-06-22 DIAGNOSIS — E162 Hypoglycemia, unspecified: Secondary | ICD-10-CM

## 2017-06-22 DIAGNOSIS — R7303 Prediabetes: Secondary | ICD-10-CM

## 2017-06-22 DIAGNOSIS — R251 Tremor, unspecified: Secondary | ICD-10-CM

## 2017-06-22 DIAGNOSIS — N401 Enlarged prostate with lower urinary tract symptoms: Secondary | ICD-10-CM | POA: Diagnosis not present

## 2017-06-22 LAB — POCT GLYCOSYLATED HEMOGLOBIN (HGB A1C): Hemoglobin A1C: 5.7

## 2017-06-22 MED ORDER — AMBULATORY NON FORMULARY MEDICATION: 0 refills | Status: AC

## 2017-06-22 NOTE — Progress Notes (Signed)
Michael Gallegos is a 67 y.o. male who presents to [facility] [place] today for episode of shaking.  2 nights ago Michael Gallegos was in his normal state of health.  He began feeling fatigued and hungry.  He went to the kitchen and was found by his wife a few minutes later shaking uncontrollably.  He was seated in the chair and denies any loss of bowel or bladder.  The symptoms spontaneously resolved in a few minutes.  He denies any twitching or history of seizures.  He denies any biting of his tongue.  He is concerned this may have been a TIA or hypoglycemic episode.  He notes he has had several episodes where he was a glucometer and notes that his blood sugar was in the 60s.  He did not measure his blood sugar around the time of the incident.  He denies chest pain or palpitations or shortness of breath.  He had a stress echocardiogram June 2017 following a syncopal episode that was unremarkable  Currently feels well with no fevers or chills vomiting or diarrhea.  Additionally Michael Gallegos notes continued chronic low back pain.  He is done well in the past with physical therapy and is interested in restarting again if possible.  He denies any radiating pain weakness or numbness.  Past Medical History:  Diagnosis Date  . Prediabetes    Past Surgical History:  Procedure Laterality Date  . UMBILICAL HERNIA REPAIR     Social History   Tobacco Use  . Smoking status: Never Smoker  . Smokeless tobacco: Never Used  Substance Use Topics  . Alcohol use: Yes    Alcohol/week: 0.0 oz    Comment: Occasional   family history includes Hypertension in his father.  ROS as above:  Medications: Current Outpatient Medications  Medication Sig Dispense Refill  . AMBULATORY NON FORMULARY MEDICATION Single glucometer with lancets, test strips. Test daily . Prediabetes 1 each 0  . diclofenac sodium  (VOLTAREN) 1 % GEL Apply 4 gr to affect joint 4x times per day. Pt needs f/u appt w/PCP for refills. 100 g 0  . fluticasone (FLONASE) 50 MCG/ACT nasal spray Place 1-2 sprays into both nostrils daily. Pt needs f/u appt w/PCP for refills 16 g 0  . naproxen (NAPROSYN) 375 MG tablet Take 1 tablet (375 mg total) by mouth 2 (two) times daily with a meal. Pt needs f/u appt w/PCP for refills 60 tablet 0  . omeprazole (PRILOSEC) 40 MG capsule Take 1 capsule (40 mg total) by mouth daily. 90 capsule 1  . tamsulosin (FLOMAX) 0.4 MG CAPS capsule Take 1 capsule (0.4 mg total) by mouth daily after breakfast. 90 capsule 1  . triamcinolone cream (KENALOG) 0.1 % Apply 1 application topically 2 (two) times daily. 454 g 12   No current facility-administered medications for this visit.    No Known Allergies  Health Maintenance Health Maintenance  Topic Date Due  . Hepatitis C Screening  July 18, 1950  . INFLUENZA VACCINE  05/12/2018 (Originally 10/18/2017)  . PNA vac Low Risk Adult (1 of 2 - PCV13) 06/23/2018 (Originally 06/30/2015)  . TETANUS/TDAP  05/12/2021 (Originally 06/29/1969)  . COLON CANCER SCREENING ANNUAL FOBT  01/08/2018  . COLONOSCOPY  01/09/2027     Exam:  BP 124/78   Pulse 67   Wt 176 lb (79.8 kg)   BMI 26.76 kg/m  Gen: Well NAD nontoxic appearing HEENT: EOMI,  MMM Lungs: Normal work of  breathing. CTABL Heart: RRR no MRG Abd: NABS, Soft. Nondistended, Nontender Exts: Brisk capillary refill, warm and well perfused.  Neuro alert and oriented normal coordination balance gait sensation. Normal speech thought process  Twelve-lead EKG shows normal sinus rhythm at 60-minute.  No ST segment elevation or depression.  Normal EKG.  Results for orders placed or performed in visit on 06/22/17 (from the past 72 hour(s))  POCT HgB A1C     Status: None   Collection Time: 06/22/17 11:19 AM  Result Value Ref Range   Hemoglobin A1C 5.7    Stress echocardiogram from June 2017 reviewed    Assessment  and Plan: 67 y.o. male with  Shaking: Symptoms are more consistent with a hypoglycemic episode than TIA.  However it is reasonable to workup both.  Plan for metabolic workup including CBC complete metabolic panel, TSH, insulin.  Additionally I have prescribed a glucometer for use with symptoms and daily.  Recheck in 1 month and bring glucose log  Possible TIA workup: Plan to complete TIA workup with brain MRI, carotid duplex ultrasound, and Holter monitor.  His heart exam and EKG are unchanged today.  I do not think we are going to learn much with a repeat ECHO at this time. Will hold off on that test.   Back Pain: Refer PT.  Recheck in 1 month.   Health maintenance: Obtain Hep C   Orders Placed This Encounter  Procedures  . CBC  . COMPLETE METABOLIC PANEL WITH GFR  . TSH  . Insulin, Free (Bioactive)  . Hepatitis C antibody  . Ambulatory referral to Physical Therapy    Referral Priority:   Routine    Referral Type:   Physical Medicine    Referral Reason:   Specialty Services Required    Requested Specialty:   Physical Therapy  . POCT HgB A1C   Meds ordered this encounter  Medications  . AMBULATORY NON FORMULARY MEDICATION    Sig: Single glucometer with lancets, test strips. Test daily . Prediabetes    Dispense:  1 each    Refill:  0     Discussed warning signs or symptoms. Please see discharge instructions. Patient expresses understanding.

## 2017-06-22 NOTE — Patient Instructions (Signed)
Thank you for coming in today. Get labs now.  Check blood sugar daily and when feeling bad.  Return in 1 month.  Bring the blood sugar log with you to the next visit.  You should hear about more testing.   Pt for back   Hypoglycemia Hypoglycemia occurs when the level of sugar (glucose) in the blood is too low. Glucose is a type of sugar that provides the body's main source of energy. Certain hormones (insulin and glucagon) control the level of glucose in the blood. Insulin lowers blood glucose, and glucagon increases blood glucose. Hypoglycemia can result from having too much insulin in the bloodstream, or from not eating enough food that contains glucose. Hypoglycemia can happen in people who do or do not have diabetes. It can develop quickly, and it can be a medical emergency. What are the causes? Hypoglycemia occurs most often in people who have diabetes. If you have diabetes, hypoglycemia may be caused by:  Diabetes medicine.  Not eating enough, or not eating often enough.  Increased physical activity.  Drinking alcohol, especially when you have not eaten recently.  If you do not have diabetes, hypoglycemia may be caused by:  A tumor in the pancreas. The pancreas is the organ that makes insulin.  Not eating enough, or not eating for long periods at a time (fasting).  Severe infection or illness that affects the liver, heart, or kidneys.  Certain medicines.  You may also have reactive hypoglycemia. This condition causes hypoglycemia within 4 hours of eating a meal. This may occur after having stomach surgery. Sometimes, the cause of reactive hypoglycemia is not known. What increases the risk? Hypoglycemia is more likely to develop in:  People who have diabetes and take medicines to lower blood glucose.  People who abuse alcohol.  People who have a severe illness.  What are the signs or symptoms? Hypoglycemia may not cause any symptoms. If you have symptoms, they may  include:  Hunger.  Anxiety.  Sweating and feeling clammy.  Confusion.  Dizziness or feeling light-headed.  Sleepiness.  Nausea.  Increased heart rate.  Headache.  Blurry vision.  Seizure.  Nightmares.  Tingling or numbness around the mouth, lips, or tongue.  A change in speech.  Decreased ability to concentrate.  A change in coordination.  Restless sleep.  Tremors or shakes.  Fainting.  Irritability.  How is this diagnosed? Hypoglycemia is diagnosed with a blood test to measure your blood glucose level. This blood test is done while you are having symptoms. Your health care provider may also do a physical exam and review your medical history. If you do not have diabetes, other tests may be done to find the cause of your hypoglycemia. How is this treated? This condition can often be treated by immediately eating or drinking something that contains glucose, such as:  3-4 sugar tablets (glucose pills).  Glucose gel, 15-gram tube.  Fruit juice, 4 oz (120 mL).  Regular soda (not diet soda), 4 oz (120 mL).  Low-fat milk, 4 oz (120 mL).  Several pieces of hard candy.  Sugar or honey, 1 Tbsp.  Treating Hypoglycemia If You Have Diabetes  If you are alert and able to swallow safely, follow the 15:15 rule:  Take 15 grams of a rapid-acting carbohydrate. Rapid-acting options include: ? 1 tube of glucose gel. ? 3 glucose pills. ? 6-8 pieces of hard candy. ? 4 oz (120 mL) of fruit juice. ? 4 oz (120 ml) of regular (not diet) soda.  Check your blood glucose 15 minutes after you take the carbohydrate.  If the repeat blood glucose level is still at or below 70 mg/dL (3.9 mmol/L), take 15 grams of a carbohydrate again.  If your blood glucose level does not increase above 70 mg/dL (3.9 mmol/L) after 3 tries, seek emergency medical care.  After your blood glucose level returns to normal, eat a meal or a snack within 1 hour.  Treating Severe  Hypoglycemia Severe hypoglycemia is when your blood glucose level is at or below 54 mg/dL (3 mmol/L). Severe hypoglycemia is an emergency. Do not wait to see if the symptoms will go away. Get medical help right away. Call your local emergency services (911 in the [place] Do not drive yourself to the hospital. If you have severe hypoglycemia and you cannot eat or drink, you may need an injection of glucagon. A family member or close friend should learn how to check your blood glucose and how to give you a glucagon injection. Ask your health care provider if you need to have an emergency glucagon injection kit available. Severe hypoglycemia may need to be treated in a hospital. The treatment may include getting glucose through an IV tube. You may also need treatment for the cause of your hypoglycemia. Follow these instructions at home: General instructions  Avoid any diets that cause you to not eat enough food. Talk with your health care provider before you start any new diet.  Take over-the-counter and prescription medicines only as told by your health care provider.  Limit alcohol intake to no more than 1 drink per day for nonpregnant women and 2 drinks per day for men. One drink equals 12 oz of beer, 5 oz of wine, or 1 oz of hard liquor.  Keep all follow-up visits as told by your health care provider. This is important. If You Have Diabetes:   Make sure you know the symptoms of hypoglycemia.  Always have a rapid-acting carbohydrate snack with you to treat low blood sugar.  Follow your diabetes management plan, as told by your health care provider. Make sure you: ? Take your medicines as directed. ? Follow your exercise plan. ? Follow your meal plan. Eat on time, and do not skip meals. ? Check your blood glucose as often as directed. Make sure to check your blood glucose before and after exercise. If you exercise longer or in a different way than usual, check your blood glucose more  often. ? Follow your sick day plan whenever you cannot eat or drink normally. Make this plan in advance with your health care provider.  Share your diabetes management plan with people in your workplace, school, and household.  Check your urine for ketones when you are ill and as told by your health care provider.  Carry a medical alert card or wear medical alert jewelry. If You Have Reactive Hypoglycemia or Low Blood Sugar From Other Causes:  Monitor your blood glucose as told by your health care provider.  Follow instructions from your health care provider about eating or drinking restrictions. Contact a health care provider if:  You have problems keeping your blood glucose in your target range.  You have frequent episodes of hypoglycemia. Get help right away if:  You continue to have hypoglycemia symptoms after eating or drinking something containing glucose.  Your blood glucose is at or below 54 mg/dL (3 mmol/L).  You have a seizure.  You faint. These symptoms may represent a serious problem that is an  emergency. Do not wait to see if the symptoms will go away. Get medical help right away. Call your local emergency services (911 in the [place] Do not drive yourself to the hospital. This information is not intended to replace advice given to you by your health care provider. Make sure you discuss any questions you have with your health care provider. Document Released: 03/06/2005 Document Revised: 08/18/2015 Document Reviewed: 04/09/2015 Elsevier Interactive Patient Education  [employer].

## 2017-06-26 LAB — CBC
HEMATOCRIT: 44 % (ref 38.5–50.0)
HEMOGLOBIN: 15.2 g/dL (ref 13.2–17.1)
MCH: 29.8 pg (ref 27.0–33.0)
MCHC: 34.5 g/dL (ref 32.0–36.0)
MCV: 86.3 fL (ref 80.0–100.0)
MPV: 9.4 fL (ref 7.5–12.5)
Platelets: 275 10*3/uL (ref 140–400)
RBC: 5.1 10*6/uL (ref 4.20–5.80)
RDW: 13 % (ref 11.0–15.0)
WBC: 4.5 10*3/uL (ref 3.8–10.8)

## 2017-06-26 LAB — COMPLETE METABOLIC PANEL WITH GFR
AG RATIO: 1.8 (calc) (ref 1.0–2.5)
ALBUMIN MSPROF: 4.6 g/dL (ref 3.6–5.1)
ALKALINE PHOSPHATASE (APISO): 60 U/L (ref 40–115)
ALT: 17 U/L (ref 9–46)
AST: 18 U/L (ref 10–35)
BILIRUBIN TOTAL: 0.5 mg/dL (ref 0.2–1.2)
BUN: 13 mg/dL (ref 7–25)
CHLORIDE: 101 mmol/L (ref 98–110)
CO2: 31 mmol/L (ref 20–32)
Calcium: 9.7 mg/dL (ref 8.6–10.3)
Creat: 0.98 mg/dL (ref 0.70–1.25)
GFR, EST AFRICAN AMERICAN: 93 mL/min/{1.73_m2} (ref >=60)
GFR, Est Non African American: 80 mL/min/{1.73_m2} (ref >=60)
GLOBULIN: 2.6 g/dL (ref 1.9–3.7)
Glucose, Bld: 91 mg/dL (ref 65–99)
POTASSIUM: 4.5 mmol/L (ref 3.5–5.3)
SODIUM: 139 mmol/L (ref 135–146)
TOTAL PROTEIN: 7.2 g/dL (ref 6.1–8.1)

## 2017-06-26 LAB — HEPATITIS C ANTIBODY
Hepatitis C Ab: NONREACTIVE
SIGNAL TO CUT-OFF: 0.07 (ref <1.00)

## 2017-06-26 LAB — INSULIN, FREE (BIOACTIVE): Insulin, Free: 6.1 u[IU]/mL (ref 1.5–14.9)

## 2017-06-26 LAB — TSH: TSH: 1.31 m[IU]/L (ref 0.40–4.50)

## 2017-06-27 ENCOUNTER — Other Ambulatory Visit: Payer: Self-pay | Admitting: Family Medicine

## 2017-06-27 DIAGNOSIS — R251 Tremor, unspecified: Secondary | ICD-10-CM

## 2017-06-27 DIAGNOSIS — Z1389 Encounter for screening for other disorder: Secondary | ICD-10-CM

## 2017-06-27 DIAGNOSIS — G459 Transient cerebral ischemic attack, unspecified: Secondary | ICD-10-CM

## 2017-07-02 ENCOUNTER — Ambulatory Visit (INDEPENDENT_AMBULATORY_CARE_PROVIDER_SITE_OTHER): Payer: Medicare HMO

## 2017-07-02 ENCOUNTER — Ambulatory Visit (HOSPITAL_COMMUNITY)
Admission: RE | Admit: 2017-07-02 | Discharge: 2017-07-02 | Disposition: A | Discharge status: Home / Self Care | Payer: Medicare HMO | Source: Ambulatory Visit | Attending: Surgery | Admitting: Surgery

## 2017-07-02 DIAGNOSIS — G459 Transient cerebral ischemic attack, unspecified: Secondary | ICD-10-CM | POA: Diagnosis not present

## 2017-07-02 DIAGNOSIS — R251 Tremor, unspecified: Secondary | ICD-10-CM

## 2017-07-02 DIAGNOSIS — Z77018 Contact with and (suspected) exposure to other hazardous metals: Secondary | ICD-10-CM | POA: Diagnosis not present

## 2017-07-02 DIAGNOSIS — Z1389 Encounter for screening for other disorder: Secondary | ICD-10-CM

## 2017-07-02 DIAGNOSIS — Z135 Encounter for screening for eye and ear disorders: Secondary | ICD-10-CM | POA: Diagnosis not present

## 2017-07-02 NOTE — Addendum Note (Signed)
Addended by: Huel Cote on: 07/02/2017 04:33 PM   Modules accepted: Orders

## 2017-07-06 ENCOUNTER — Ambulatory Visit (INDEPENDENT_AMBULATORY_CARE_PROVIDER_SITE_OTHER): Payer: Medicare HMO

## 2017-07-06 DIAGNOSIS — G459 Transient cerebral ischemic attack, unspecified: Secondary | ICD-10-CM

## 2017-07-06 DIAGNOSIS — R251 Tremor, unspecified: Secondary | ICD-10-CM | POA: Diagnosis not present

## 2017-07-11 ENCOUNTER — Other Ambulatory Visit: Payer: Self-pay

## 2017-07-11 ENCOUNTER — Telehealth: Payer: Self-pay | Admitting: Emergency Medicine

## 2017-07-11 DIAGNOSIS — R7303 Prediabetes: Secondary | ICD-10-CM

## 2017-07-11 MED ORDER — GLUCOSE BLOOD VI STRP: ORAL_STRIP | 99 refills | Status: AC

## 2017-07-11 MED ORDER — LANCETS MISC: 99 refills | Status: AC

## 2017-07-11 NOTE — Telephone Encounter (Signed)
E-prescribed meter, strips and lancets.

## 2017-08-30 ENCOUNTER — Other Ambulatory Visit: Payer: Self-pay | Admitting: Family Medicine

## 2017-12-13 DIAGNOSIS — Z77090 Contact with and (suspected) exposure to asbestos: Secondary | ICD-10-CM | POA: Diagnosis not present

## 2017-12-13 DIAGNOSIS — J209 Acute bronchitis, unspecified: Secondary | ICD-10-CM | POA: Diagnosis not present

## 2017-12-13 DIAGNOSIS — R918 Other nonspecific abnormal finding of lung field: Secondary | ICD-10-CM | POA: Diagnosis not present

## 2017-12-13 DIAGNOSIS — J309 Allergic rhinitis, unspecified: Secondary | ICD-10-CM | POA: Diagnosis not present

## 2017-12-17 ENCOUNTER — Other Ambulatory Visit: Payer: Self-pay | Admitting: Family Medicine

## 2018-01-01 DIAGNOSIS — R140 Abdominal distension (gaseous): Secondary | ICD-10-CM | POA: Diagnosis not present

## 2018-01-01 DIAGNOSIS — J929 Pleural plaque without asbestos: Secondary | ICD-10-CM | POA: Diagnosis not present

## 2018-01-01 DIAGNOSIS — K7689 Other specified diseases of liver: Secondary | ICD-10-CM | POA: Diagnosis not present

## 2018-01-01 DIAGNOSIS — R0789 Other chest pain: Secondary | ICD-10-CM | POA: Diagnosis not present

## 2018-01-02 DIAGNOSIS — J920 Pleural plaque with presence of asbestos: Secondary | ICD-10-CM | POA: Diagnosis not present

## 2018-01-02 DIAGNOSIS — R911 Solitary pulmonary nodule: Secondary | ICD-10-CM | POA: Diagnosis not present

## 2018-01-05 ENCOUNTER — Other Ambulatory Visit: Payer: Self-pay | Admitting: Family Medicine

## 2018-02-01 DIAGNOSIS — R1319 Other dysphagia: Secondary | ICD-10-CM | POA: Diagnosis not present

## 2018-02-25 NOTE — Progress Notes (Deleted)
Subjective:   Michael Gallegos is a 67 y.o. male who presents for an Initial Medicare Annual Wellness Visit.  Review of Systems  No ROS.  Medicare Wellness Visit. Additional risk factors are reflected in the social history.     Sleep patterns:   Home Safety/Smoke Alarms: Feels safe in home. Smoke alarms in place.  Living environment;  Seat Belt Safety/Bike Helmet: Wears seat belt.    Male:   CCS-  utd   PSA-  Lab Results  Component Value Date   PSA 0.4 02/08/2016       Objective:    There were no vitals filed for this visit. There is no height or weight on file to calculate BMI.  No flowsheet data found.  Current Medications (verified) Outpatient Encounter Medications as of 03/05/2018  Medication Sig  . AMBULATORY NON FORMULARY MEDICATION Single glucometer with lancets, test strips. Test daily . Prediabetes  . diclofenac sodium (VOLTAREN) 1 % GEL Apply 4 gr to affect joint 4x times per day. Pt needs f/u appt w/PCP for refills.  . diclofenac sodium (VOLTAREN) 1 % GEL APPLY FOUR GRAMS TO THE AFFECTED JOINT FOUR TIMES PER DAY  . fluticasone (FLONASE) 50 MCG/ACT nasal spray Place 1-2 sprays into both nostrils daily. Pt needs f/u appt w/PCP for refills  . glucose blood test strip Please include preferred meter. Check fasting blood sugar daily. Diagnosis Pre diabetes ICD 10 R73.03  . Lancets MISC Check fasting blood sugar daily. Diagnosis Pre diabetes ICD 10 R73.03  . naproxen (NAPROSYN) 375 MG tablet Take 1 tablet (375 mg total) by mouth 2 (two) times daily with a meal. Pt needs f/u appt w/PCP for refills  . omeprazole (PRILOSEC) 40 MG capsule TAKE 1 CAPSULE (40 MG TOTAL) BY MOUTH DAILY.  . tamsulosin (FLOMAX) 0.4 MG CAPS capsule Take 1 capsule (0.4 mg total) by mouth daily after breakfast.  . tamsulosin (FLOMAX) 0.4 MG CAPS capsule TAKE 1 CAPSULE (0.4 MG TOTAL) BY MOUTH DAILY AFTER BREAKFAST. PT NEEDS F/U APPT W/PCP FOR REFILLS  . triamcinolone cream (KENALOG) 0.1 % Apply 1  application topically 2 (two) times daily.   No facility-administered encounter medications on file as of 03/05/2018.     Allergies (verified) Patient has no known allergies.   History: Past Medical History:  Diagnosis Date  . Prediabetes    Past Surgical History:  Procedure Laterality Date  . UMBILICAL HERNIA REPAIR     Family History  Problem Relation Age of Onset  . Hypertension Father   . Heart disease Neg Hx    Social History   Socioeconomic History  . Marital status: Married    Spouse name: Not on file  . Number of children: 7  . Years of education: Not on file  . Highest education level: Not on file  Occupational History  . Not on file  Social Needs  . Financial resource strain: Not on file  . Food insecurity:    Worry: Not on file    Inability: Not on file  . Transportation needs:    Medical: Not on file    Non-medical: Not on file  Tobacco Use  . Smoking status: Never Smoker  . Smokeless tobacco: Never Used  Substance and Sexual Activity  . Alcohol use: Yes    Alcohol/week: 0.0 standard drinks    Comment: Occasional  . Drug use: No  . Sexual activity: Not on file  Lifestyle  . Physical activity:    Days per week: Not on file  Minutes per session: Not on file  . Stress: Not on file  Relationships  . Social connections:    Talks on phone: Not on file    Gets together: Not on file    Attends religious service: Not on file    Active member of club or organization: Not on file    Attends meetings of clubs or organizations: Not on file    Relationship status: Not on file  Other Topics Concern  . Not on file  Social History Narrative  . Not on file   Tobacco Counseling Counseling given: Not Answered   Clinical Intake:                       Activities of Daily Living No flowsheet data found.   Immunizations and Health Maintenance  There is no immunization history on file for this patient. Health Maintenance Due  Topic  Date Due  . COLON CANCER SCREENING ANNUAL FOBT  01/08/2018    Patient Care Team: Gregor Hams, MD as PCP - General (Family Medicine)  Indicate any recent Medical Services you may have received from other than [facility] providers in the past year (date may be approximate).    Assessment:   This is a routine wellness examination for Ward.Physical assessment deferred to PCP.   Hearing/Vision screen No exam data present  Dietary issues and exercise activities discussed:   Diet  Breakfast: Lunch:  Dinner:       Goals   None    Depression Screen PHQ 2/9 Scores 06/22/2017 05/12/2016  PHQ - 2 Score 0 0    Fall Risk Fall Risk  06/22/2017 05/12/2016  Falls in the past year? No No    Is the patient's home free of loose throw rugs in walkways, pet beds, electrical cords, etc?   {Blank single:19197::"yes","no"}      Grab bars in the bathroom? {Blank single:19197::"yes","no"}      Handrails on the stairs?   {Blank single:19197::"yes","no"}      Adequate lighting?   {Blank single:19197::"yes","no"}   Cognitive Function:        Screening Tests Health Maintenance  Topic Date Due  . COLON CANCER SCREENING ANNUAL FOBT  01/08/2018  . INFLUENZA VACCINE  05/12/2018 (Originally 10/18/2017)  . PNA vac Low Risk Adult (1 of 2 - PCV13) 06/23/2018 (Originally 06/30/2015)  . TETANUS/TDAP  05/12/2021 (Originally 06/29/1969)  . COLONOSCOPY  01/09/2027  . Hepatitis C Screening  Completed       Plan:   ***  I have personally reviewed and noted the following in the patient's chart:   . Medical and social history . Use of alcohol, tobacco or illicit drugs  . Current medications and supplements . Functional ability and status . Nutritional status . Physical activity . Advanced directives . List of other physicians . Hospitalizations, surgeries, and ER visits in previous 12 months . Vitals . Screenings to include cognitive, depression, and falls . Referrals and appointments  In addition,  I have reviewed and discussed with patient certain preventive protocols, quality metrics, and best practice recommendations. A written personalized care plan for preventive services as well as general preventive health recommendations were provided to patient.     Joanne Chars, LPN   [id number]

## 2018-03-05 ENCOUNTER — Ambulatory Visit: Payer: Self-pay

## 2018-03-11 ENCOUNTER — Other Ambulatory Visit: Payer: Self-pay | Admitting: Family Medicine

## 2018-04-18 ENCOUNTER — Encounter: Payer: Self-pay | Admitting: Family Medicine

## 2018-04-18 ENCOUNTER — Ambulatory Visit (INDEPENDENT_AMBULATORY_CARE_PROVIDER_SITE_OTHER): Payer: Medicare HMO | Admitting: Family Medicine

## 2018-04-18 VITALS — BP 122/74 | HR 92 | Ht 68.0 in | Wt 178.0 lb

## 2018-04-18 DIAGNOSIS — R202 Paresthesia of skin: Secondary | ICD-10-CM

## 2018-04-18 DIAGNOSIS — N401 Enlarged prostate with lower urinary tract symptoms: Secondary | ICD-10-CM

## 2018-04-18 DIAGNOSIS — R7303 Prediabetes: Secondary | ICD-10-CM | POA: Diagnosis not present

## 2018-04-18 DIAGNOSIS — I861 Scrotal varices: Secondary | ICD-10-CM | POA: Diagnosis not present

## 2018-04-18 DIAGNOSIS — R351 Nocturia: Secondary | ICD-10-CM | POA: Diagnosis not present

## 2018-04-18 MED ORDER — FLUTICASONE PROPIONATE 50 MCG/ACT NA SUSP: 1.0000 | Freq: Every day | NASAL | 12 refills | Status: DC

## 2018-04-18 MED ORDER — TRIAMCINOLONE ACETONIDE 0.1 % EX CREA: 1.0000 "application " | TOPICAL_CREAM | Freq: Two times a day (BID) | CUTANEOUS | 12 refills | Status: DC

## 2018-04-18 MED ORDER — GABAPENTIN 300 MG PO CAPS: ORAL_CAPSULE | ORAL | 3 refills | Status: DC

## 2018-04-18 NOTE — Progress Notes (Signed)
Had EMG leg

## 2018-04-18 NOTE — Patient Instructions (Addendum)
Thank you for coming in today. Get labs now.  Take gabapentin as needed for numb burning pain.   Use over the counter Lamisil cream for the foot 2x daily for 2 week.  Get the generic terbinafine cream.   Use the triamcinolone cream as needed for rash or dry skin.    If the foot burning does not improve next step is nerve study or lumbar spine MRI.   Follow up in 1 month.   Paresthesia Paresthesia is an abnormal burning or prickling sensation. It is usually felt in the hands, arms, legs, or feet. However, it may occur in any part of the body. Usually, paresthesia is not painful. It may feel like:  Tingling or numbness.  Pins and needles.  Skin crawling.  Buzzing.  Arms or legs falling asleep.  Itching. Paresthesia may occur without any clear cause, or it may be caused by:  Breathing too quickly (hyperventilation).  Pressure on a nerve.  An underlying medical condition.  Side effects of a medication.  Nutritional deficiencies.  Exposure to toxic chemicals. Most people experience temporary (transient) paresthesia at some time in their lives. For some people, it may be long-lasting (chronic) because of an underlying medical condition. If you have paresthesia that lasts a long time, you may need to be evaluated by your health care provider. Follow these instructions at home: Alcohol use   Do not drink alcohol if: ? Your health care provider tells you not to drink. ? You are pregnant, may be pregnant, or are planning to become pregnant.  If you drink alcohol, limit how much you have: ? 0-1 drink a day for women. ? 0-2 drinks a day for men.  Be aware of how much alcohol is in your drink. In the [place] one drink equals one typical bottle of beer (12 oz), one-half glass of wine (5 oz), or one shot of hard liquor (1 oz). Nutrition  Eat a healthy diet. This includes: ? Eating foods that are high in fiber, such as fresh fruits and vegetables, whole grains, and  beans. ? Limiting foods that are high in fat and processed sugars, such as fried or sweet foods. General instructions  Take over-the-counter and prescription medicines only as told by your health care provider.  Do not use any products that contain nicotine or tobacco, such as cigarettes and e-cigarettes. These can keep blood from reaching damaged nerves. If you need help quitting, ask your health care provider.  If you have diabetes, work closely with your health care provider to keep your blood sugar under control.  If you have numbness in your feet: ? Check every day for signs of injury or infection. Watch for redness, warmth, and swelling. ? Wear padded socks and comfortable shoes. These help protect your feet.  Keep all follow-up visits as told by your health care provider. This is important. Contact a health care provider if you:  Have paresthesia that gets worse or does not go away.  Have a burning or prickling feeling that gets worse when you walk.  Have pain, cramps, or dizziness.  Develop a rash. Get help right away if you:  Feel weak.  Have trouble walking or moving.  Have problems with speech, understanding, or vision.  Feel confused.  Cannot control your bladder or bowel movements.  Have numbness after an injury.  Develop new weakness in an arm or leg.  Faint. Summary  Paresthesia is an abnormal burning or prickling sensation that is usually felt in the  hands, arms, legs, or feet. It may also occur in other parts of the body.  Paresthesia may occur without any clear cause, or it may be caused by breathing too quickly (hyperventilation), pressure on a nerve, an underlying medical condition, side effects of a medication, nutritional deficiencies, or exposure to toxic chemicals.  If you have paresthesia that lasts a long time, you may need to be evaluated by your health care provider. This information is not intended to replace advice given to you by your  health care provider. Make sure you discuss any questions you have with your health care provider. Document Released: 02/24/2002 Document Revised: 03/15/2017 Document Reviewed: 03/15/2017 Elsevier Interactive Patient Education  2019 Elsevier Inc.   Gabapentin capsules or tablets What is this medicine? GABAPENTIN (GA ba pen tin) is used to control seizures in certain types of epilepsy. It is also used to treat certain types of nerve pain. This medicine may be used for other purposes; ask your health care provider or pharmacist if you have questions. COMMON BRAND NAME(S): Active-PAC with Gabapentin, Gabarone, Neurontin What should I tell my health care provider before I take this medicine? They need to know if you have any of these conditions: -kidney disease -suicidal thoughts, plans, or attempt; a previous suicide attempt by you or a family member -an unusual or allergic reaction to gabapentin, other medicines, foods, dyes, or preservatives -pregnant or trying to get pregnant -breast-feeding How should I use this medicine? Take this medicine by mouth with a glass of water. Follow the directions on the prescription label. You can take it with or without food. If it upsets your stomach, take it with food. Take your medicine at regular intervals. Do not take it more often than directed. Do not stop taking except on your doctor's advice. If you are directed to break the 600 or 800 mg tablets in half as part of your dose, the extra half tablet should be used for the next dose. If you have not used the extra half tablet within 28 days, it should be thrown away. A special MedGuide will be given to you by the pharmacist with each prescription and refill. Be sure to read this information carefully each time. Talk to your pediatrician regarding the use of this medicine in children. While this drug may be prescribed for children as young as 3 years for selected conditions, precautions do  apply. Overdosage: If you think you have taken too much of this medicine contact a poison control center or emergency room at once. NOTE: This medicine is only for you. Do not share this medicine with others. What if I miss a dose? If you miss a dose, take it as soon as you can. If it is almost time for your next dose, take only that dose. Do not take double or extra doses. What may interact with this medicine? Do not take this medicine with any of the following medications: -other gabapentin products This medicine may also interact with the following medications: -alcohol -antacids -antihistamines for allergy, cough and cold -certain medicines for anxiety or sleep -certain medicines for depression or psychotic disturbances -homatropine; hydrocodone -naproxen -narcotic medicines (opiates) for pain -phenothiazines like chlorpromazine, mesoridazine, prochlorperazine, thioridazine This list may not describe all possible interactions. Give your health care provider a list of all the medicines, herbs, non-prescription drugs, or dietary supplements you use. Also tell them if you smoke, drink alcohol, or use illegal drugs. Some items may interact with your medicine. What should I watch  for while using this medicine? Visit your doctor or health care professional for regular checks on your progress. You may want to keep a record at home of how you feel your condition is responding to treatment. You may want to share this information with your doctor or health care professional at each visit. You should contact your doctor or health care professional if your seizures get worse or if you have any new types of seizures. Do not stop taking this medicine or any of your seizure medicines unless instructed by your doctor or health care professional. Stopping your medicine suddenly can increase your seizures or their severity. Wear a medical identification bracelet or chain if you are taking this medicine for  seizures, and carry a card that lists all your medications. You may get drowsy, dizzy, or have blurred vision. Do not drive, use machinery, or do anything that needs mental alertness until you know how this medicine affects you. To reduce dizzy or fainting spells, do not sit or stand up quickly, especially if you are an older patient. Alcohol can increase drowsiness and dizziness. Avoid alcoholic drinks. Your mouth may get dry. Chewing sugarless gum or sucking hard candy, and drinking plenty of water will help. The use of this medicine may increase the chance of suicidal thoughts or actions. Pay special attention to how you are responding while on this medicine. Any worsening of mood, or thoughts of suicide or dying should be reported to your health care professional right away. Women who become pregnant while using this medicine may enroll in the [place] Pregnancy Registry by calling [phone]. This registry collects information about the safety of antiepileptic drug use during pregnancy. What side effects may I notice from receiving this medicine? Side effects that you should report to your doctor or health care professional as soon as possible: -allergic reactions like skin rash, itching or hives, swelling of the face, lips, or tongue -worsening of mood, thoughts or actions of suicide or dying Side effects that usually do not require medical attention (report to your doctor or health care professional if they continue or are bothersome): -constipation -difficulty walking or controlling muscle movements -dizziness -nausea -slurred speech -tiredness -tremors -weight gain This list may not describe all possible side effects. Call your doctor for medical advice about side effects. You may report side effects to FDA at 1-800-FDA-1088. Where should I keep my medicine? Keep out of reach of children. This medicine may cause accidental overdose and death if it taken by  other adults, children, or pets. Mix any unused medicine with a substance like cat litter or coffee grounds. Then throw the medicine away in a sealed container like a sealed bag or a coffee can with a lid. Do not use the medicine after the expiration date. Store at room temperature between 15 and 30 degrees C (59 and 86 degrees F). NOTE: This sheet is a summary. It may not cover all possible information. If you have questions about this medicine, talk to your doctor, pharmacist, or health care provider.  2019 Elsevier/Gold Standard (2017-08-09 13:21:44)

## 2018-04-18 NOTE — Progress Notes (Signed)
Michael Gallegos is a 68 y.o. male who presents to [facility] [facility]: [place] today for itching and burning. Michael Gallegos started having generalized pruritis about two weeks ago while he was in [place]. Then his feet started to burn and itch terribly, "so itchy that I just want to pull my toes off". His feet are really sensitive to light touch, even just touching his foot can make it start itching. Sometimes he sees black dots on the soles of his feet. He then noticed itching on his forehead that improved with triamcinolone cream. When he is very itchy, he has seen patches of red dots on his neck and leg. He says the dots look like pimples but there is no pus. His feet and hands sometimes go numb at night.  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  He had an EMG a few years ago that did show some neuropathy.  The itching is worse after a hot shower. Denies changes in his normal headaches, fevers, chills, body aches, abdominal pain, changes in bowel movement, blood in stool, dysuria, or increased urinary frequency.   He is also concerned about his testicle. He had a testicular ultrasound which did not reveal any abnormalities. However, he still feels that it is swelling and feels pressure inside. He also feels "a really cold sensation, like an ice cube" on his testicle when he urinates.    ROS as above:  Exam:  BP 122/74   Pulse 92   Ht 5\' 8"  (1.727 m)   Wt 178 lb (80.7 kg)   BMI 27.06 kg/m  Wt Readings from Last 5 Encounters:  04/18/18 178 lb (80.7 kg)  06/22/17 176 lb (79.8 kg)  10/04/16 171 lb (77.6 kg)  05/24/16 176 lb (79.8 kg)  05/12/16 174 lb (78.9 kg)    Gen: Well NAD HEENT: EOMI,  MMM Lungs: Normal work of breathing. CTABL Heart: RRR no MRG Abd: NABS, Soft. Nondistended, Nontender GU: Slight palpable fullness present in the right scrotum above the testicle as  compared to the left.  Consistent with small varicocele. Exts: Brisk capillary refill, warm and well perfused.  Skin: No pruritic rashes present today.  Feet: Round macular areas of black discoloration on the soles of his feet bilaterally.      Assessment and Plan: 68 y.o. male with  Paresthesia: Michael Gallegos has had intermittent burning and itching sensations in his feet, with worsening at night, for the past two weeks. Considering both tinea pedis or radiculopathy as the possible cause. Prescribed gabapentin for numbness and burning. Advised getting OTC terbinafine cream for two weeks. If foot does not improve, next step is nerve study or lumbar spine MRI. Follow-up in one month.   Dry skin: Michael Gallegos has had generalized pruritis and red dots on his skin for the past two weeks. Worsens after shower. Likely dry skin due to the weather. Prescribed triamcinolone to use as needed.   Varicocele: Discussed that this is a benign finding.  Plan for watchful waiting.  With labs will also check PSA due to BPH.  Orders Placed This Encounter  Procedures  . CBC with Differential/Platelet  . COMPLETE METABOLIC PANEL WITH GFR  . TSH  . PSA   Meds ordered this encounter  Medications  . gabapentin (NEURONTIN) 300 MG capsule    Sig: One tab PO qHS for a week, then BID for a week, then TID. May double weekly  to a max of 3,600mg /day    Dispense:  180 capsule    Refill:  3  . triamcinolone cream (KENALOG) 0.1 %    Sig: Apply 1 application topically 2 (two) times daily.    Dispense:  454 g    Refill:  12  . fluticasone (FLONASE) 50 MCG/ACT nasal spray    Sig: Place 1-2 sprays into both nostrils daily.    Dispense:  48 g    Refill:  12     Historical information moved to improve visibility of documentation.  Past Medical History:  Diagnosis Date  . Prediabetes    Past Surgical History:  Procedure Laterality Date  . UMBILICAL HERNIA REPAIR     Social History   Tobacco Use  . Smoking  status: Never Smoker  . Smokeless tobacco: Never Used  Substance Use Topics  . Alcohol use: Yes    Alcohol/week: 0.0 standard drinks    Comment: Occasional   family history includes Hypertension in his father.  Medications: Current Outpatient Medications  Medication Sig Dispense Refill  . AMBULATORY NON FORMULARY MEDICATION Single glucometer with lancets, test strips. Test daily . Prediabetes 1 each 0  . diclofenac sodium (VOLTAREN) 1 % GEL Apply 4 gr to affect joint 4x times per day. Pt needs f/u appt w/PCP for refills. 100 g 0  . diclofenac sodium (VOLTAREN) 1 % GEL APPLY FOUR GRAMS TO THE AFFECTED JOINT FOUR TIMES PER DAY 700 g 0  . fluticasone (FLONASE) 50 MCG/ACT nasal spray Place 1-2 sprays into both nostrils daily. Pt needs f/u appt w/PCP for refills 16 g 0  . glucose blood test strip Please include preferred meter. Check fasting blood sugar daily. Diagnosis Pre diabetes ICD 10 R73.03 100 each prn  . Lancets MISC Check fasting blood sugar daily. Diagnosis Pre diabetes ICD 10 R73.03 100 each prn  . naproxen (NAPROSYN) 375 MG tablet Take 1 tablet (375 mg total) by mouth 2 (two) times daily with a meal. Pt needs f/u appt w/PCP for refills 60 tablet 0  . omeprazole (PRILOSEC) 40 MG capsule TAKE 1 CAPSULE (40 MG TOTAL) BY MOUTH DAILY. 90 capsule 1  . tamsulosin (FLOMAX) 0.4 MG CAPS capsule Take 1 capsule (0.4 mg total) by mouth daily after breakfast. 90 capsule 1  . tamsulosin (FLOMAX) 0.4 MG CAPS capsule TAKE 1 CAPSULE (0.4 MG TOTAL) BY MOUTH DAILY AFTER BREAKFAST. PT NEEDS F/U APPT W/PCP FOR REFILLS 15 capsule 0  . triamcinolone cream (KENALOG) 0.1 % Apply 1 application topically 2 (two) times daily. 454 g 12   No current facility-administered medications for this visit.    No Known Allergies   Discussed warning signs or symptoms. Please see discharge instructions. Patient expresses understanding.  I personally was present and performed or re-performed the history, physical exam  and medical decision-making activities of this service and have verified that the service and findings are accurately documented in the student's note. ___________________________________________ Lynne Leader M.D., ABFM., CAQSM. Primary Care and Sports Medicine Adjunct Instructor of [employer]

## 2018-04-18 NOTE — Progress Notes (Deleted)
Subjective:   Michael Gallegos is a 68 y.o. male who presents for an Initial Medicare Annual Wellness Visit.  Review of Systems  No ROS.  Medicare Wellness Visit. Additional risk factors are reflected in the social history.     Sleep patterns:    Home Safety/Smoke Alarms: Feels safe in home. Smoke alarms in place.  Living environment;  Seat Belt Safety/Bike Helmet: Wears seat belt.   Male:   CCS-  utd   PSA-  Lab Results  Component Value Date   PSA 0.4 02/08/2016       Objective:    There were no vitals filed for this visit. There is no height or weight on file to calculate BMI.  No flowsheet data found.  Current Medications (verified) Outpatient Encounter Medications as of 04/22/2018  Medication Sig  . AMBULATORY NON FORMULARY MEDICATION Single glucometer with lancets, test strips. Test daily . Prediabetes  . diclofenac sodium (VOLTAREN) 1 % GEL Apply 4 gr to affect joint 4x times per day. Pt needs f/u appt w/PCP for refills.  . diclofenac sodium (VOLTAREN) 1 % GEL APPLY FOUR GRAMS TO THE AFFECTED JOINT FOUR TIMES PER DAY  . fluticasone (FLONASE) 50 MCG/ACT nasal spray Place 1-2 sprays into both nostrils daily. Pt needs f/u appt w/PCP for refills  . glucose blood test strip Please include preferred meter. Check fasting blood sugar daily. Diagnosis Pre diabetes ICD 10 R73.03  . Lancets MISC Check fasting blood sugar daily. Diagnosis Pre diabetes ICD 10 R73.03  . naproxen (NAPROSYN) 375 MG tablet Take 1 tablet (375 mg total) by mouth 2 (two) times daily with a meal. Pt needs f/u appt w/PCP for refills  . omeprazole (PRILOSEC) 40 MG capsule TAKE 1 CAPSULE (40 MG TOTAL) BY MOUTH DAILY.  . tamsulosin (FLOMAX) 0.4 MG CAPS capsule Take 1 capsule (0.4 mg total) by mouth daily after breakfast.  . tamsulosin (FLOMAX) 0.4 MG CAPS capsule TAKE 1 CAPSULE (0.4 MG TOTAL) BY MOUTH DAILY AFTER BREAKFAST. PT NEEDS F/U APPT W/PCP FOR REFILLS  . triamcinolone cream (KENALOG) 0.1 % Apply 1  application topically 2 (two) times daily.   No facility-administered encounter medications on file as of 04/22/2018.     Allergies (verified) Patient has no known allergies.   History: Past Medical History:  Diagnosis Date  . Prediabetes    Past Surgical History:  Procedure Laterality Date  . UMBILICAL HERNIA REPAIR     Family History  Problem Relation Age of Onset  . Hypertension Father   . Heart disease Neg Hx    Social History   Socioeconomic History  . Marital status: Married    Spouse name: Not on file  . Number of children: 7  . Years of education: Not on file  . Highest education level: Not on file  Occupational History  . Not on file  Social Needs  . Financial resource strain: Not on file  . Food insecurity:    Worry: Not on file    Inability: Not on file  . Transportation needs:    Medical: Not on file    Non-medical: Not on file  Tobacco Use  . Smoking status: Never Smoker  . Smokeless tobacco: Never Used  Substance and Sexual Activity  . Alcohol use: Yes    Alcohol/week: 0.0 standard drinks    Comment: Occasional  . Drug use: No  . Sexual activity: Not on file  Lifestyle  . Physical activity:    Days per week: Not on file  Minutes per session: Not on file  . Stress: Not on file  Relationships  . Social connections:    Talks on phone: Not on file    Gets together: Not on file    Attends religious service: Not on file    Active member of club or organization: Not on file    Attends meetings of clubs or organizations: Not on file    Relationship status: Not on file  Other Topics Concern  . Not on file  Social History Narrative  . Not on file   Tobacco Counseling Counseling given: Not Answered   Clinical Intake:                       Activities of Daily Living No flowsheet data found.   Immunizations and Health Maintenance  There is no immunization history on file for this patient. Health Maintenance Due  Topic Date  Due  . INFLUENZA VACCINE  10/18/2017  . COLON CANCER SCREENING ANNUAL FOBT  01/08/2018    Patient Care Team: Gregor Hams, MD as PCP - General (Family Medicine)  Indicate any recent Medical Services you may have received from other than [facility] providers in the past year (date may be approximate).    Assessment:   This is a routine wellness examination for [place].Physical assessment deferred to PCP.   Hearing/Vision screen No exam data present  Dietary issues and exercise activities discussed:   Diet  Breakfast: Lunch:  Dinner:       Goals   None    Depression Screen PHQ 2/9 Scores 06/22/2017 05/12/2016  PHQ - 2 Score 0 0    Fall Risk Fall Risk  06/22/2017 05/12/2016  Falls in the past year? No No    Is the patient's home free of loose throw rugs in walkways, pet beds, electrical cords, etc?   {Blank single:19197::"yes","no"}      Grab bars in the bathroom? {Blank single:19197::"yes","no"}      Handrails on the stairs?   {Blank single:19197::"yes","no"}      Adequate lighting?   {Blank single:19197::"yes","no"}   Cognitive Function:        Screening Tests Health Maintenance  Topic Date Due  . INFLUENZA VACCINE  10/18/2017  . COLON CANCER SCREENING ANNUAL FOBT  01/08/2018  . PNA vac Low Risk Adult (1 of 2 - PCV13) 06/23/2018 (Originally 06/30/2015)  . TETANUS/TDAP  05/12/2021 (Originally 06/29/1969)  . COLONOSCOPY  01/09/2027  . Hepatitis C Screening  Completed       Plan:   ***  I have personally reviewed and noted the following in the patient's chart:   . Medical and social history . Use of alcohol, tobacco or illicit drugs  . Current medications and supplements . Functional ability and status . Nutritional status . Physical activity . Advanced directives . List of other physicians . Hospitalizations, surgeries, and ER visits in previous 12 months . Vitals . Screenings to include cognitive, depression, and falls . Referrals and appointments  In  addition, I have reviewed and discussed with patient certain preventive protocols, quality metrics, and best practice recommendations. A written personalized care plan for preventive services as well as general preventive health recommendations were provided to patient.     Joanne Chars, LPN   4/40/1027

## 2018-04-19 LAB — CBC WITH DIFFERENTIAL/PLATELET
Absolute Monocytes: 356 cells/uL (ref 200–950)
BASOS PCT: 0.4 %
Basophils Absolute: 18 cells/uL (ref 0–200)
EOS ABS: 50 {cells}/uL (ref 15–500)
Eosinophils Relative: 1.1 %
HCT: 42.7 % (ref 38.5–50.0)
Hemoglobin: 14.7 g/dL (ref 13.2–17.1)
Lymphs Abs: 3173 cells/uL (ref 850–3900)
MCH: 30.2 pg (ref 27.0–33.0)
MCHC: 34.4 g/dL (ref 32.0–36.0)
MCV: 87.7 fL (ref 80.0–100.0)
MONOS PCT: 7.9 %
MPV: 9.5 fL (ref 7.5–12.5)
Neutro Abs: 905 cells/uL — ABNORMAL LOW (ref 1500–7800)
Neutrophils Relative %: 20.1 %
Platelets: 282 10*3/uL (ref 140–400)
RBC: 4.87 10*6/uL (ref 4.20–5.80)
RDW: 13.1 % (ref 11.0–15.0)
TOTAL LYMPHOCYTE: 70.5 %
WBC: 4.5 10*3/uL (ref 3.8–10.8)

## 2018-04-19 LAB — COMPLETE METABOLIC PANEL WITH GFR
AG Ratio: 1.6 (calc) (ref 1.0–2.5)
ALT: 20 U/L (ref 9–46)
AST: 20 U/L (ref 10–35)
Albumin: 4.4 g/dL (ref 3.6–5.1)
Alkaline phosphatase (APISO): 62 U/L (ref 40–115)
BUN: 12 mg/dL (ref 7–25)
CO2: 28 mmol/L (ref 20–32)
Calcium: 9.7 mg/dL (ref 8.6–10.3)
Chloride: 105 mmol/L (ref 98–110)
Creat: 0.94 mg/dL (ref 0.70–1.25)
GFR, EST NON AFRICAN AMERICAN: 84 mL/min/{1.73_m2} (ref >=60)
GFR, Est African American: 97 mL/min/{1.73_m2} (ref >=60)
Globulin: 2.8 g/dL (calc) (ref 1.9–3.7)
Glucose, Bld: 88 mg/dL (ref 65–99)
Potassium: 4 mmol/L (ref 3.5–5.3)
Sodium: 140 mmol/L (ref 135–146)
Total Bilirubin: 0.6 mg/dL (ref 0.2–1.2)
Total Protein: 7.2 g/dL (ref 6.1–8.1)

## 2018-04-19 LAB — TSH: TSH: 1.03 mIU/L (ref 0.40–4.50)

## 2018-04-19 LAB — PSA: PSA: 0.7 ng/mL (ref <=4.0)

## 2018-04-20 ENCOUNTER — Other Ambulatory Visit: Payer: Self-pay | Admitting: Family Medicine

## 2018-04-22 ENCOUNTER — Ambulatory Visit: Payer: Self-pay

## 2018-04-24 ENCOUNTER — Other Ambulatory Visit: Payer: Self-pay

## 2018-04-24 MED ORDER — NAPROXEN 375 MG PO TABS: ORAL_TABLET | ORAL | 1 refills | Status: DC

## 2018-04-24 MED ORDER — DICLOFENAC SODIUM 1 % TD GEL: TRANSDERMAL | 3 refills | Status: DC

## 2018-04-30 ENCOUNTER — Ambulatory Visit (INDEPENDENT_AMBULATORY_CARE_PROVIDER_SITE_OTHER): Payer: Medicare HMO | Admitting: *Deleted

## 2018-04-30 VITALS — BP 127/71 | HR 83 | Ht 68.0 in | Wt 178.0 lb

## 2018-04-30 DIAGNOSIS — Z0000 Encounter for general adult medical examination without abnormal findings: Secondary | ICD-10-CM

## 2018-04-30 DIAGNOSIS — R202 Paresthesia of skin: Secondary | ICD-10-CM | POA: Diagnosis not present

## 2018-04-30 MED ORDER — GABAPENTIN 300 MG PO CAPS: ORAL_CAPSULE | ORAL | 3 refills | Status: DC

## 2018-04-30 NOTE — Progress Notes (Signed)
Subjective:   Michael Gallegos is a 68 y.o. male who presents for an Initial Medicare Annual Wellness Visit.  Review of Systems  No ROS.  Medicare Wellness Visit. Additional risk factors are reflected in the social history.  Cardiac Risk Factors include: advanced age (>88men, >25 women);male gender  Sleep patterns: getting 6 hours of sleep at night.Wakes up during the night 2-3 times a night to go to the bathroom. Feels rested upon waking up   Home Safety/Smoke Alarms: Feels safe in home. Smoke alarms in place.  Living environment; Lives with wife in 1 story home no steps in the home. Shower is a step over shower and no grab bars in place. Seat Belt Safety/Bike Helmet: Wears seat belt.    Male:   CCS-  utd   PSA-  utd Lab Results  Component Value Date   PSA 0.7 04/18/2018   PSA 0.4 02/08/2016       Objective:    Today's Vitals   04/30/18 1545  BP: 127/71  Pulse: 83  SpO2: 97%  Weight: 178 lb (80.7 kg)  Height: 5\' 8"  (1.727 m)   Body mass index is 27.06 kg/m.  Advanced Directives 04/30/2018  Does Patient Have a Medical Advance Directive? No  Would patient like information on creating a medical advance directive? Yes (MAU/Ambulatory/Procedural Areas - Information given)    Current Medications (verified) Outpatient Encounter Medications as of 04/30/2018  Medication Sig  . AMBULATORY NON FORMULARY MEDICATION Single glucometer with lancets, test strips. Test daily . Prediabetes  . diclofenac sodium (VOLTAREN) 1 % GEL APPLY FOUR GRAMS TO THE AFFECTED JOINT FOUR TIMES PER DAY  . fluticasone (FLONASE) 50 MCG/ACT nasal spray Place 1-2 sprays into both nostrils daily.  Marland Kitchen glucose blood test strip Please include preferred meter. Check fasting blood sugar daily. Diagnosis Pre diabetes ICD 10 R73.03  . Lancets MISC Check fasting blood sugar daily. Diagnosis Pre diabetes ICD 10 R73.03  . naproxen (NAPROSYN) 375 MG tablet TAKE 1 TABLET BY MOUTH 2 TIMES DAILY WITH A MEAL  .  omeprazole (PRILOSEC) 40 MG capsule TAKE 1 CAPSULE (40 MG TOTAL) BY MOUTH DAILY.  . tamsulosin (FLOMAX) 0.4 MG CAPS capsule Take 1 capsule (0.4 mg total) by mouth daily after breakfast.  . tamsulosin (FLOMAX) 0.4 MG CAPS capsule TAKE 1 CAPSULE (0.4 MG TOTAL) BY MOUTH DAILY AFTER BREAKFAST.  Marland Kitchen triamcinolone cream (KENALOG) 0.1 % Apply 1 application topically 2 (two) times daily.  Marland Kitchen gabapentin (NEURONTIN) 300 MG capsule One tab PO qHS for a week, then BID for a week, then TID. May double weekly to a max of 3,600mg /day (Patient not taking: Reported on 04/30/2018)  . [DISCONTINUED] diclofenac sodium (VOLTAREN) 1 % GEL Apply 4 gr to affect joint 4x times per day.  . [DISCONTINUED] tamsulosin (FLOMAX) 0.4 MG CAPS capsule TAKE 1 CAPSULE (0.4 MG TOTAL) BY MOUTH DAILY AFTER BREAKFAST. PT NEEDS F/U APPT W/PCP FOR REFILLS   No facility-administered encounter medications on file as of 04/30/2018.     Allergies (verified) Patient has no known allergies.   History: Past Medical History:  Diagnosis Date  . Chronic lung disease    Asbestos  . Prediabetes    Past Surgical History:  Procedure Laterality Date  . UMBILICAL HERNIA REPAIR     Family History  Problem Relation Age of Onset  . Hypertension Father   . Heart disease Neg Hx    Social History   Socioeconomic History  . Marital status: Married    Spouse  name: [name]  . Number of children: 7  . Years of education: 85  . Highest education level: 11th grade  Occupational History  . 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 Financial resource strain: Not hard at all  . Food insecurity:    Worry: Never true    Inability: Never true  . Transportation needs:    Medical: No    Non-medical: No  Tobacco Use  . Smoking status: Never Smoker  . Smokeless tobacco: Never Used  Substance and Sexual Activity  . Alcohol use: Not Currently    Alcohol/week: 0.0 standard drinks    Comment: Occasional  . Drug use:  No  . Sexual activity: Yes  Lifestyle  . Physical activity:    Days per week: 0 days    Minutes per session: 0 min  . Stress: Not at all  Relationships  . Social connections:    Talks on phone: Three times a week    Gets together: Once a week    Attends religious service: 1 to 4 times per year    Active member of club or organization: No    Attends meetings of clubs or organizations: Never    Relationship status: Married  Other Topics Concern  . Not on file  Social History Narrative   Has joined the [facility] and is going to start going to the gym.Coffee and ginger tea daily   Tobacco Counseling Counseling given: Not Answered   Clinical Intake:                       Activities of Daily Living In your present state of health, do you have any difficulty performing the following activities: 04/30/2018  Hearing? N  Vision? N  Difficulty concentrating or making decisions? N  Walking or climbing stairs? N  Dressing or bathing? N  Doing errands, shopping? N  Preparing Food and eating ? N  Using the Toilet? N  In the past six months, have you accidently leaked urine? N  Do you have problems with loss of bowel control? N  Managing your Medications? N  Managing your Finances? N  Housekeeping or managing your Housekeeping? N  Some recent data might be hidden     Immunizations and Health Maintenance  There is no immunization history on file for this patient. Health Maintenance Due  Topic Date Due  . COLON CANCER SCREENING ANNUAL FOBT  01/08/2018    Patient Care Team: Gregor Hams, MD as PCP - General (Family Medicine)  Indicate any recent Medical Services you may have received from other than [facility] providers in the past year (date may be approximate).    Assessment:   This is a routine wellness examination for [name]   Hearing/Vision screen  Visual Acuity Screening   Right eye Left eye Both eyes  Without correction: 20/40 20/30 20/25   With  correction:     Hearing Screening Comments: Whisper test done and patient reported back all 3 words given correctly  Dietary issues and exercise activities discussed: Current Exercise Habits: The patient does not participate in regular exercise at present, Exercise limited by: None identified Diet eating a healthy diet with veggies, salads, avacados, spinach Breakfast: egg sausage or oatmeal boiled egg Lunch: skip Dinner: Meat and veggies and salad      Goals    . Exercise 150 min/wk Moderate Activity     Start working out at [employer] at least  3 times a week for at least 30 minutes a day      Depression Screen PHQ 2/9 Scores 04/30/2018 06/22/2017 05/12/2016  PHQ - 2 Score 0 0 0    Fall Risk Fall Risk  04/30/2018 06/22/2017 05/12/2016  Falls in the past year? 0 No No  Follow up Falls prevention discussed - -    Is the patient's home free of loose throw rugs in walkways, pet beds, electrical cords, etc?   yes      Grab bars in the bathroom? no      Handrails on the stairs?   no      Adequate lighting?   yes   Cognitive Function:     6CIT Screen 04/30/2018  What Year? 0 points  What month? 0 points  What time? 3 points  Count back from 20 0 points  Months in reverse 0 points  Repeat phrase 2 points  Total Score 5    Screening Tests Health Maintenance  Topic Date Due  . COLON CANCER SCREENING ANNUAL FOBT  01/08/2018  . INFLUENZA VACCINE  06/18/2018 (Originally 10/18/2017)  . PNA vac Low Risk Adult (1 of 2 - PCV13) 06/23/2018 (Originally 06/30/2015)  . TETANUS/TDAP  05/12/2021 (Originally 06/29/1969)  . COLONOSCOPY  01/09/2027  . Hepatitis C Screening  Completed       Plan:    Please schedule your next medicare wellness visit with me in 1 yr.   Mr. Hunger , Thank you for taking time to come for your Medicare Wellness Visit. I appreciate your ongoing commitment to your health goals. Please review the following plan we discussed and let me know if I can assist you in the  future.  Continue doing brain stimulating activities (puzzles, reading, adult coloring books, staying active) to keep memory sharp.  Bring a copy of your living will and/or healthcare power of attorney to your next office visit.   These are the goals we discussed: Goals    . Exercise 150 min/wk Moderate Activity     Start working out at [employer] at least 3 times a week for at least 30 minutes a day       This is a list of the screening recommended for you and due dates:  Health Maintenance  Topic Date Due  . Stool Blood Test  01/08/2018  . Flu Shot  06/18/2018*  . Pneumonia vaccines (1 of 2 - PCV13) 06/23/2018*  . Tetanus Vaccine  05/12/2021*  . Colon Cancer Screening  01/09/2027  .  Hepatitis C: One time screening is recommended by Center for Disease Control  (CDC) for  adults born from [birth year] through 1965.   Completed  *Topic was postponed. The date shown is not the original due date.     I have personally reviewed and noted the following in the patient's chart:   . Medical and social history . Use of alcohol, tobacco or illicit drugs  . Current medications and supplements . Functional ability and status . Nutritional status . Physical activity . Advanced directives . List of other physicians . Hospitalizations, surgeries, and ER visits in previous 12 months . Vitals . Screenings to include cognitive, depression, and falls . Referrals and appointments  In addition, I have reviewed and discussed with patient certain preventive protocols, quality metrics, and best practice recommendations. A written personalized care plan for preventive services as well as general preventive health recommendations were provided to patient.     Joanne Chars, LPN  04/30/2018       

## 2018-04-30 NOTE — Patient Instructions (Signed)
Michael Gallegos , Thank you for taking time to come for your Medicare Wellness Visit. I appreciate your ongoing commitment to your health goals. Please review the following plan we discussed and let me know if I can assist you in the future.  Continue doing brain stimulating activities (puzzles, reading, adult coloring books, staying active) to keep memory sharp.  Bring a copy of your living will and/or healthcare power of attorney to your next office visit. These are the goals we discussed: Goals    . Exercise 150 min/wk Moderate Activity     Start working out at [employer] at least 3 times a week for at least 30 minutes a day

## 2018-05-01 ENCOUNTER — Telehealth: Payer: Self-pay | Admitting: Family Medicine

## 2018-05-01 MED ORDER — NAPROXEN 500 MG PO TABS: ORAL_TABLET | ORAL | 1 refills | Status: DC

## 2018-05-01 MED ORDER — DICLOFENAC SODIUM 1 % TD GEL: TRANSDERMAL | 12 refills | Status: AC

## 2018-05-01 NOTE — Telephone Encounter (Signed)
Refill request received from [facility].  Medication sent electronically.

## 2018-05-20 ENCOUNTER — Ambulatory Visit: Payer: Self-pay | Admitting: Family Medicine

## 2018-07-17 ENCOUNTER — Telehealth: Payer: Self-pay | Admitting: Family Medicine

## 2018-07-17 NOTE — Telephone Encounter (Signed)
I noticed that you are due for your pneumonia vaccine.  We can do that now as a nurse visit.  Please call [phone] and schedule a nurse visit to administer Pneumovax 23 vaccine for pneumonia.

## 2018-07-17 NOTE — Telephone Encounter (Signed)
Left pt detailed msg on cell advising on need for pneumonia shot. Advised him that we offer these in drive-thru manner.   Call back info provided for patient to get scheduled.

## 2018-08-02 ENCOUNTER — Other Ambulatory Visit: Payer: Self-pay | Admitting: Family Medicine

## 2018-08-02 NOTE — Telephone Encounter (Signed)
Please call pt due for follow up

## 2018-08-02 NOTE — Telephone Encounter (Signed)
Left message with information below and for patient to call us back to schedule an appointment or webex. °

## 2018-08-23 ENCOUNTER — Other Ambulatory Visit: Payer: Self-pay | Admitting: Family Medicine

## 2018-08-26 ENCOUNTER — Telehealth: Payer: Self-pay | Admitting: Family Medicine

## 2018-09-12 NOTE — Telephone Encounter (Signed)
Opened in error

## 2018-09-15 ENCOUNTER — Other Ambulatory Visit: Payer: Self-pay | Admitting: Family Medicine

## 2018-09-23 ENCOUNTER — Other Ambulatory Visit: Payer: Self-pay | Admitting: Neurology

## 2018-09-23 MED ORDER — OMEPRAZOLE 40 MG PO CPDR: 40.0000 mg | DELAYED_RELEASE_CAPSULE | Freq: Every day | ORAL | 0 refills | Status: DC

## 2018-09-23 NOTE — Telephone Encounter (Signed)
Request for 90 days received via paper RX. RX sent.

## 2018-10-21 DIAGNOSIS — E109 Type 1 diabetes mellitus without complications: Secondary | ICD-10-CM | POA: Diagnosis not present

## 2018-10-21 DIAGNOSIS — H5200 Hypermetropia, unspecified eye: Secondary | ICD-10-CM | POA: Diagnosis not present

## 2018-10-21 DIAGNOSIS — Z135 Encounter for screening for eye and ear disorders: Secondary | ICD-10-CM | POA: Diagnosis not present

## 2018-10-21 DIAGNOSIS — H1789 Other corneal scars and opacities: Secondary | ICD-10-CM | POA: Diagnosis not present

## 2018-10-21 LAB — HM DIABETES EYE EXAM

## 2018-10-23 ENCOUNTER — Telehealth: Payer: Self-pay | Admitting: Family Medicine

## 2018-10-23 NOTE — Telephone Encounter (Signed)
Received diabetic eye exam report.  Date of service was 10/21/2018. No diabetic retinopathy. Result will be sent abstract.

## 2018-11-05 ENCOUNTER — Encounter: Payer: Self-pay | Admitting: Sports Medicine

## 2018-11-05 ENCOUNTER — Ambulatory Visit (INDEPENDENT_AMBULATORY_CARE_PROVIDER_SITE_OTHER): Payer: Medicare HMO

## 2018-11-05 ENCOUNTER — Ambulatory Visit (HOSPITAL_BASED_OUTPATIENT_CLINIC_OR_DEPARTMENT_OTHER): Admission: RE | Admit: 2018-11-05 | Payer: Medicare HMO | Source: Ambulatory Visit

## 2018-11-05 ENCOUNTER — Ambulatory Visit (INDEPENDENT_AMBULATORY_CARE_PROVIDER_SITE_OTHER): Payer: Medicare HMO | Admitting: Sports Medicine

## 2018-11-05 ENCOUNTER — Other Ambulatory Visit: Payer: Self-pay

## 2018-11-05 VITALS — BP 150/89 | HR 99 | Ht 68.0 in | Wt 175.0 lb

## 2018-11-05 DIAGNOSIS — R072 Precordial pain: Secondary | ICD-10-CM | POA: Diagnosis not present

## 2018-11-05 DIAGNOSIS — R5383 Other fatigue: Secondary | ICD-10-CM | POA: Diagnosis not present

## 2018-11-05 DIAGNOSIS — R350 Frequency of micturition: Secondary | ICD-10-CM | POA: Diagnosis not present

## 2018-11-05 DIAGNOSIS — R7309 Other abnormal glucose: Secondary | ICD-10-CM | POA: Diagnosis not present

## 2018-11-05 DIAGNOSIS — R079 Chest pain, unspecified: Secondary | ICD-10-CM | POA: Diagnosis not present

## 2018-11-05 DIAGNOSIS — R7989 Other specified abnormal findings of blood chemistry: Secondary | ICD-10-CM | POA: Diagnosis not present

## 2018-11-05 LAB — POCT URINALYSIS DIPSTICK
Bilirubin, UA: NEGATIVE
Glucose, UA: NEGATIVE
Ketones, UA: NEGATIVE
Leukocytes, UA: NEGATIVE
Nitrite, UA: NEGATIVE
Protein, UA: NEGATIVE
Spec Grav, UA: 1.01 (ref 1.010–1.025)
Urobilinogen, UA: 0.2 E.U./dL
pH, UA: 6.5 (ref 5.0–8.0)

## 2018-11-05 MED ORDER — ASPIRIN EC 81 MG PO TBEC: 81.0000 mg | DELAYED_RELEASE_TABLET | Freq: Every day | ORAL | 3 refills | Status: DC

## 2018-11-05 MED ORDER — METOPROLOL TARTRATE 25 MG PO TABS: 25.0000 mg | ORAL_TABLET | Freq: Two times a day (BID) | ORAL | 3 refills | Status: DC

## 2018-11-05 MED ORDER — ALUM & MAG HYDROXIDE-SIMETH 200-200-20 MG/5ML PO SUSP
30.0000 mL | Freq: Once | ORAL | Status: AC
  Administered 2018-11-05: 30 mL via ORAL

## 2018-11-05 NOTE — Assessment & Plan Note (Addendum)
Substernal chest pain radiating from the chest through to the back. This is been present now for 9 days. Elevated blood pressure. This occurs often with eating and drinking. Not really with exertion. He did have a stress echo back in 2017 that was unremarkable. ECG today shows a new T wave inversion in lead V2.  Isoelectric in aVF. We have not gotten routine labs in a while so ordering this including cardiac enzymes, d-dimer. Chest x-ray looking for mediastinal widening. Chest pain was not significantly altered with a GI cocktail here in the office, this test is effective for ruling in a GI source of pain if effective but not for ruling out a cardiac source of pain. Adding metoprolol, aspirin.  Elevated Dimer, negative cardiac enzymes.  Needs stat CT angio, labs in Epic, spoke to CT tech at [facility], left message for patient.  I have attempted to call the patient multiple additional times through the evening on both phone numbers listed.  He was advised at the office visit to leave his phone on for a likely follow-up call and follow-up testing.  He was informed of the risks of noncompliance in the office visit, because he has not responded to the multiple calls we can only assume that he does not desire any further testing.

## 2018-11-05 NOTE — Addendum Note (Signed)
Addended by: Silverio Decamp on: 11/05/2018 07:21 PM   Modules accepted: Orders

## 2018-11-05 NOTE — Progress Notes (Addendum)
Subjective:    CC: Chest pain  HPI: This is a pleasant 68 year old male, for the past 9 days he has had substernal chest pain, worse with deep breathing, radiating to between the shoulder blades, nonexertional, he does have a history of a negative stress echo from about 3 years ago.  He does have occasional episodes of heartburn.  Also has a history of asbestosis.  No nausea, no diaphoresis, no hemoptysis, no hematochezia, no melena.  Symptoms are moderate, persistent.  I reviewed the past medical history, family history, social history, surgical history, and allergies today and no changes were needed.  Please see the problem list section below in epic for further details.  Past Medical History: Past Medical History:  Diagnosis Date  . Chronic lung disease    Asbestos  . Prediabetes    Past Surgical History: Past Surgical History:  Procedure Laterality Date  . UMBILICAL HERNIA REPAIR     Social History: Social History   Socioeconomic History  . Marital status: Married    Spouse name: [name]  . Number of children: 7  . Years of education: 98  . Highest education level: 11th grade  Occupational History  . 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 Financial resource strain: Not hard at all  . Food insecurity    Worry: Never true    Inability: Never true  . Transportation needs    Medical: No    Non-medical: No  Tobacco Use  . Smoking status: Never Smoker  . Smokeless tobacco: Never Used  Substance and Sexual Activity  . Alcohol use: Not Currently    Alcohol/week: 0.0 standard drinks    Comment: Occasional  . Drug use: No  . Sexual activity: Yes  Lifestyle  . Physical activity    Days per week: 0 days    Minutes per session: 0 min  . Stress: Not at all  Relationships  . Social Herbalist on phone: Three times a week    Gets together: Once a week    Attends religious service: 1 to 4 times per year    Active member of  club or organization: No    Attends meetings of clubs or organizations: Never    Relationship status: Married  Other Topics Concern  . Not on file  Social History Narrative   Has joined the [facility] and is going to start going to the gym.Coffee and ginger tea daily   Family History: Family History  Problem Relation Age of Onset  . Hypertension Father   . Heart disease Neg Hx    Allergies: No Known Allergies Medications: See med rec.  Review of Systems: No fevers, chills, night sweats, weight loss, chest pain, or shortness of breath.   Objective:    General: Well Developed, well nourished, and in no acute distress.  Neuro: Alert and oriented x3, extra-ocular muscles intact, sensation grossly intact.  HEENT: Normocephalic, atraumatic, pupils equal round reactive to light, neck supple, no masses, no lymphadenopathy, thyroid nonpalpable.  Skin: Warm and dry, no rashes. Cardiac: Regular rate and rhythm, no murmurs rubs or gallops, no lower extremity edema.  Respiratory: Clear to auscultation bilaterally. Not using accessory muscles, speaking in full sentences. Abdomen: Soft, nontender, nondistended, bowel sounds, no palpable masses, no guarding, rigidity, rebound tenderness.  Twelve-lead ECG reviewed, normal sinus rhythm with new T wave inversion on V2, isoelectric in aVF.  Impression and Recommendations:    Chest pain Substernal chest  pain radiating from the chest through to the back. This is been present now for 9 days. Elevated blood pressure. This occurs often with eating and drinking. Not really with exertion. He did have a stress echo back in 2017 that was unremarkable. ECG today shows a new T wave inversion in lead V2.  Isoelectric in aVF. We have not gotten routine labs in a while so ordering this including cardiac enzymes, d-dimer. Chest x-ray looking for mediastinal widening. Chest pain was not significantly altered with a GI cocktail here in the office, this test is  effective for ruling in a GI source of pain if effective but not for ruling out a cardiac source of pain. Adding metoprolol, aspirin.  Elevated Dimer, negative cardiac enzymes.  Needs stat CT angio, labs in Epic, spoke to CT tech at [facility], left message for patient.  I have attempted to call the patient multiple additional times through the evening on both phone numbers listed.  He was advised at the office visit to leave his phone on for a likely follow-up call and follow-up testing.  He was informed of the risks of noncompliance in the office visit, because he has not responded to the multiple calls we can only assume that he does not desire any further testing.   ___________________________________________ Gwen Her. Dianah Field, M.D., ABFM., CAQSM. Primary Care and Sports Medicine  [facility]  Adjunct Professor of [employer]

## 2018-11-06 LAB — COMPLETE METABOLIC PANEL WITH GFR
AG Ratio: 1.6 (calc) (ref 1.0–2.5)
ALT: 15 U/L (ref 9–46)
AST: 15 U/L (ref 10–35)
Albumin: 4.6 g/dL (ref 3.6–5.1)
Alkaline phosphatase (APISO): 68 U/L (ref 35–144)
BUN: 15 mg/dL (ref 7–25)
CO2: 26 mmol/L (ref 20–32)
Calcium: 9.9 mg/dL (ref 8.6–10.3)
Chloride: 104 mmol/L (ref 98–110)
Creat: 1.03 mg/dL (ref 0.70–1.25)
GFR, Est African American: 86 mL/min/{1.73_m2} (ref >=60)
GFR, Est Non African American: 74 mL/min/{1.73_m2} (ref >=60)
Globulin: 2.8 g/dL (calc) (ref 1.9–3.7)
Glucose, Bld: 124 mg/dL — ABNORMAL HIGH (ref 65–99)
Potassium: 4.1 mmol/L (ref 3.5–5.3)
Sodium: 138 mmol/L (ref 135–146)
Total Bilirubin: 0.4 mg/dL (ref 0.2–1.2)
Total Protein: 7.4 g/dL (ref 6.1–8.1)

## 2018-11-06 LAB — CK TOTAL AND CKMB (NOT AT ARMC)
CK, MB: 1 ng/mL (ref 0–5.0)
Relative Index: 0.5 (ref 0–4.0)
Total CK: 190 U/L (ref 44–196)

## 2018-11-06 LAB — D-DIMER, QUANTITATIVE: D-Dimer, Quant: 0.62 mcg/mL FEU — ABNORMAL HIGH (ref <0.50)

## 2018-11-06 LAB — CBC
HCT: 43.9 % (ref 38.5–50.0)
Hemoglobin: 14.9 g/dL (ref 13.2–17.1)
MCH: 29.9 pg (ref 27.0–33.0)
MCHC: 33.9 g/dL (ref 32.0–36.0)
MCV: 88.2 fL (ref 80.0–100.0)
MPV: 9.4 fL (ref 7.5–12.5)
Platelets: 288 10*3/uL (ref 140–400)
RBC: 4.98 10*6/uL (ref 4.20–5.80)
RDW: 13.3 % (ref 11.0–15.0)
WBC: 4.1 10*3/uL (ref 3.8–10.8)

## 2018-11-06 LAB — TROPONIN I: Troponin I: <0.01 ng/mL (ref <=0.0)

## 2018-11-06 LAB — TSH: TSH: 1.03 mIU/L (ref 0.40–4.50)

## 2018-11-06 LAB — LIPID PANEL W/REFLEX DIRECT LDL
Cholesterol: 208 mg/dL — ABNORMAL HIGH (ref <200)
HDL: 57 mg/dL (ref >=40)
LDL Cholesterol (Calc): 131 mg/dL (calc) — ABNORMAL HIGH
Non-HDL Cholesterol (Calc): 151 mg/dL (calc) — ABNORMAL HIGH (ref <130)
Total CHOL/HDL Ratio: 3.6 (calc) (ref <5.0)
Triglycerides: 97 mg/dL (ref <150)

## 2018-11-06 LAB — HEMOGLOBIN A1C
Hgb A1c MFr Bld: 5.7 % of total Hgb — ABNORMAL HIGH (ref <5.7)
Mean Plasma Glucose: 117 (calc)
eAG (mmol/L): 6.5 (calc)

## 2018-11-08 ENCOUNTER — Other Ambulatory Visit: Payer: Self-pay

## 2018-11-08 ENCOUNTER — Ambulatory Visit (HOSPITAL_BASED_OUTPATIENT_CLINIC_OR_DEPARTMENT_OTHER)
Admission: RE | Admit: 2018-11-08 | Discharge: 2018-11-08 | Disposition: A | Discharge status: Home / Self Care | Payer: Medicare HMO | Source: Ambulatory Visit | Attending: Sports Medicine | Admitting: Sports Medicine

## 2018-11-08 ENCOUNTER — Other Ambulatory Visit: Payer: Medicare HMO

## 2018-11-08 DIAGNOSIS — R0602 Shortness of breath: Secondary | ICD-10-CM | POA: Diagnosis not present

## 2018-11-08 DIAGNOSIS — R072 Precordial pain: Secondary | ICD-10-CM | POA: Insufficient documentation

## 2018-11-08 MED ORDER — IOHEXOL 350 MG/ML SOLN
100.0000 mL | Freq: Once | INTRAVENOUS | Status: AC | PRN
  Administered 2018-11-08: 13:00:00 72 mL via INTRAVENOUS

## 2018-11-14 ENCOUNTER — Encounter: Payer: Self-pay | Admitting: Family Medicine

## 2018-11-14 ENCOUNTER — Ambulatory Visit (INDEPENDENT_AMBULATORY_CARE_PROVIDER_SITE_OTHER): Payer: Medicare HMO | Admitting: Family Medicine

## 2018-11-14 VITALS — BP 145/78 | HR 90 | Temp 98.2°F | Ht 68.0 in | Wt 173.0 lb

## 2018-11-14 DIAGNOSIS — R0789 Other chest pain: Secondary | ICD-10-CM | POA: Diagnosis not present

## 2018-11-14 DIAGNOSIS — R1310 Dysphagia, unspecified: Secondary | ICD-10-CM

## 2018-11-14 DIAGNOSIS — J920 Pleural plaque with presence of asbestos: Secondary | ICD-10-CM

## 2018-11-14 DIAGNOSIS — N433 Hydrocele, unspecified: Secondary | ICD-10-CM | POA: Diagnosis not present

## 2018-11-14 DIAGNOSIS — R1319 Other dysphagia: Secondary | ICD-10-CM

## 2018-11-14 HISTORY — DX: Hydrocele, unspecified: N43.3

## 2018-11-14 MED ORDER — OMEPRAZOLE 40 MG PO CPDR: 40.0000 mg | DELAYED_RELEASE_CAPSULE | Freq: Two times a day (BID) | ORAL | 3 refills | Status: DC

## 2018-11-14 MED ORDER — SUCRALFATE 1 G PO TABS: 1.0000 g | ORAL_TABLET | Freq: Four times a day (QID) | ORAL | 0 refills | Status: DC

## 2018-11-14 NOTE — Patient Instructions (Addendum)
Thank you for coming in today. Increase omeprazole twice daily for 1 month.  Take carafte 4x daily for 1-2 weeks to line and coat the stomach.  Please contact your stomach doctor [phone] and schedule follow up visit. I will also order a new referral.  I think they need to look down your throat again.  OK to take tylenol for pain.  Get the stress test that Dr T ordered.   Recheck with me in 1 month. We will follow up this visit and your symptoms and address other issues.   Return sooner if needed.    Dysphagia Eating Plan, Bite Size Food This diet plan is for people with moderate swallowing problems who have transitioned from pureed and minced foods. Bite size foods are soft and cut into small chunks so that they can be swallowed safely. On this eating plan, you may be instructed to drink liquids that are thickened. Work with your health care provider and your diet and nutrition specialist (dietitian) to make sure that you are following the diet safely and getting all the nutrients you need. What are tips for following this plan? General guidelines for foods   You may eat foods that are tender, soft, and moist.  Always test food texture before taking a bite. Poke food with a fork or spoon to make sure it is tender.  Food should be easy to cut and shew. Avoid large pieces of food that require a lot of chewing.  Take small bites. Each bite should be smaller than your thumb nail (about 33mm by 15 mm).  If you were on pureed and minced food diet plans, you may eat any of the foods included in those diets.  Avoid foods that are very dry, hard, sticky, chewy, coarse, or crunchy.  If instructed by your health care provider, thicken liquids. Follow your health care provider's instructions for what products to use, how to do this, and to what thickness. ? Your health care provider may recommend using a commercial thickener, rice cereal, or potato flakes. Ask your health care provider to  recommend thickeners. ? Thickened liquids are usually a "pudding-like" consistency, or they may be as thick as honey or thick enough to eat with a spoon. Cooking  To moisten foods, you may add liquids while you are blending, mashing, or grinding your foods to the right consistency. These liquids include gravies, sauces, vegetable or fruit juice, milk, half and half, or water.  Strain extra liquid from foods before eating.  Reheat foods slowly to prevent a tough crust from forming.  Prepare foods in advance. Meal planning  Eat a variety of foods to get all the nutrients you need.  Some foods may be tolerated better than others. Work with your dietitian to identify which foods are safest for you to eat.  Follow your meal plan as told by your dietitian. What foods are allowed? Grains Moist breads without nuts or seeds. Biscuits, muffins, pancakes, and waffles that are well-moistened with syrup, jelly, margarine, or butter. Cooked cereals. Moist bread stuffing. Moist rice. Well-moistened cold cereal with small chunks. Well-cooked pasta, noodles, rice, and bread dressing in small pieces and thick sauce. Soft dumplings or spaetzle in small pieces and butter or gravy. Vegetables Soft, well-cooked vegetables in small pieces. Soft-cooked, mashed potatoes. Thickened vegetable juice. Fruits Canned or cooked fruits that are soft or moist and do not have skin or seeds. Fresh, soft bananas. Thickened fruit juices. Meat and other protein foods Tender, moist meats or  poultry in small pieces. Moist meatballs or meatloaf. Fish without bones. Eggs or egg substitutes in small pieces. Tofu. Tempe[place] and meat alternatives in small pieces. Well-cooked, tender beans, peas, baked beans, and other legumes. Dairy Thickened milk. Cream cheese. Yogurt. Cottage cheese. Sour cream. Small pieces of soft cheese. Fats and oils Butter. Oils. Margarine. Mayonnaise. Gravy. Spreads. Sweets and desserts Soft, smooth, moist  desserts. Pudding. Custard. Moist cakes. Jam. Jelly. Honey. Preserves. Ask your health care provider whether you can have frozen desserts. Seasoning and other foods All seasonings and sweeteners. All sauces with small chunks. Prepared tuna, egg, or chicken salad without raw fruits or vegetables. Moist casseroles with small, tender pieces of meat. Soups with tender meat. What foods are not allowed? Grains Coarse or dry cereals. Dry breads. Toast. Crackers. Tough, crusty breads, such as [place] bread and baguettes. Dry pancakes, waffles, and muffins. Sticky rice. Dry bread stuffing. Granola. Popcorn. Chips. Vegetables All raw vegetables. Cooked corn. Rubbery or stiff cooked vegetables. Stringy vegetables, such as celery. Tough, crisp fried potatoes. Potato skins. Fruits Hard, crunchy, stringy, high-pulp, and juicy raw fruits such as apples, pineapple, papaya, and watermelon. Small, round fruits, such as grapes. Dried fruit and fruit leather. Meat and other protein foods Large pieces of meat. Dry, tough meats, such as bacon, sausage, and hot dogs. Chicken, [place], or fish with skin and bones. Crunchy peanut butter. Nuts. Seeds. Nut and seed butters. Dairy Yogurt with nuts, seeds, or large chunks. Large chunks of cheese. Frozen desserts and milk consistency not allowed by your dietitian. Sweets and desserts Dry cakes. Chewy or dry cookies. Any desserts with nuts, seeds, dry fruits, coconut, pineapple, or anything dry, sticky, or hard. Chewy caramel. Licorice. Taffy-type candies. Ask your health care provider whether you can have frozen desserts. Seasoning and other foods Soups with tough or large chunks of meats, poultry, or vegetables. Corn or clam chowder. Smoothies with large chunks of fruit. Summary  Bite size foods can be helpful for people with moderate swallowing problems.  On the dysphagia eating plan, you may eat foods that are soft, moist, and cut into pieces smaller than 76mm by  51mm.  You may be instructed to thicken liquids. Follow your health care provider's instructions about how to do this and to what consistency. This information is not intended to replace advice given to you by your health care provider. Make sure you discuss any questions you have with your health care provider. Document Released: 03/06/2005 Document Revised: 06/27/2018 Document Reviewed: 06/16/2016 Elsevier Patient Education  2020 [employer].

## 2018-11-14 NOTE — Progress Notes (Signed)
Michael Gallegos is a 68 y.o. male who presents to [facility]: [place] today for follow-up chest pain and dysphagia.  Patient was seen by my partner Dr. Dianah Field on August 18.  At that time he had been having chest pain.  EKG was significant for new T wave inversion.  D-dimer was elevated and subsequently CT angiogram of the chest did not show pulmonary embolism or thoracic artery dissection.  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  Additionally he was given a GI cocktail in clinic which did not change his symptoms much.  Dr. Dianah Field ordered a stress test which is scheduled for September 9.  Patient notes that his symptoms improved a bit after seeing Dr. Dianah Field on the 18th but have returned yesterday.  He notes a central to right-sided chest pain radiating to the mid right back.  His pain is associated with a sensation that he cannot swallow solids very easily.  He also notes regurgitation and burning.  He has had issues with acid reflux and esophageal strictures in the past requiring dilation.  His most recent evaluation was about a year ago by digestive health specialists in [place].  He currently is taking omeprazole 40 mg daily.  He notes he takes it intermittently.  Additionally he has been taking Aleve over the last few weeks but discontinued it or reduce the dose recently in response to possible stomach/esophageal issues.  Additionally he notes some continued right scrotum swelling.  He notes is not particularly bothersome or changing much.  He has been evaluated for this previously with urology with ultrasound consistent for hydrocele. (Dr. Lovena Neighbours [facility])  ROS as above:  Exam:  BP (!) 145/78   Pulse 90   Temp 98.2 F (36.8 C) (Oral)   Ht 5\' 8"  (1.727 m)   Wt 173 lb (78.5 kg)   BMI  26.30 kg/m  Wt Readings from Last 5 Encounters:  11/14/18 173 lb (78.5 kg)  11/05/18 175 lb (79.4 kg)  04/30/18 178 lb (80.7 kg)  04/18/18 178 lb (80.7 kg)  06/22/17 176 lb (79.8 kg)    Gen: Well NAD HEENT: EOMI,  MMM no cervical or supraclavicular lymphadenopathy.  No neck masses palpated. Lungs: Normal work of breathing. CTABL.  Patient does have pain with deep inspiration located on right side. Chest wall: Not particularly tender to chest wall or trapezius. Heart: RRR no MRG  Abd: NABS, Soft. Nondistended, Nontender Exts: Brisk capillary refill, warm and well perfused.  Genitals: Normal-appearing penis with no discharge or lesions.  No inguinal lymphadenopathy.  Right scrotum is slightly enlarged.  Testicle is nontender with no masses palpated.  Small hydrocele/varicocele present right side scrotum.  Testicles descended bilaterally.  Lab and Radiology Results Dg Chest 2 View  Result Date: 11/05/2018 CLINICAL DATA:  Substernal chest pain for 9 days worse with deep breathing, pain radiating between shoulder blades, history chronic lung disease EXAM: CHEST - 2 VIEW COMPARISON:  06/24/2015 FINDINGS: Normal heart size, mediastinal contours, and pulmonary vascularity. Lungs clear. No infiltrate, pleural effusion or pneumothorax. Osseous structures unremarkable. IMPRESSION: No acute abnormalities. Electronically Signed   By: Lavonia Dana M.D.   On: 11/05/2018 15:18   Ct Angio Chest Pe W Or Wo Contrast  Result Date: 11/08/2018 CLINICAL DATA:  Chest pain, shortness of breath, elevated D-dimer, hyperintense probability PE EXAM: CT ANGIOGRAPHY CHEST WITH CONTRAST TECHNIQUE: Multidetector CT imaging of the chest was performed using  the standard protocol during bolus administration of intravenous contrast. Multiplanar CT image reconstructions and MIPs were obtained to evaluate the vascular anatomy. CONTRAST:  57mL OMNIPAQUE IOHEXOL 350 MG/ML SOLN COMPARISON:  None. FINDINGS: Cardiovascular: Heart size  upper limits normal. No pericardial effusion. Satisfactory opacification of pulmonary arteries noted, and there is no evidence of pulmonary emboli. Adequate contrast opacification of the thoracic aorta with no evidence of dissection, aneurysm, or stenosis. There is classic 3-vessel brachiocephalic arch anatomy without proximal stenosis. Partially calcified atheromatous plaque in the distal arch and descending thoracic segment. Mediastinum/Nodes: No hilar or mediastinal adenopathy. Lungs/Pleura: No pleural effusion. No pneumothorax. Partially calcified bilateral pleural plaques. 2 cm thin-walled bleb in the superior segment right lower lobe. Dependent atelectasis posteriorly in both lungs. No nodule or focal infiltrate. Upper Abdomen: No acute findings. Musculoskeletal: No chest wall abnormality. No acute or significant osseous findings. Review of the MIP images confirms the above findings. IMPRESSION: 1. Negative for acute PE or thoracic aortic dissection. 2. 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 Aortic Atherosclerosis (ICD10-I70.0). Electronically Signed   By: Lucrezia Europe M.D.   On: 11/08/2018 13:45  I personally (independently) visualized and performed the interpretation of the images attached in this note.  Recent Results (from the past 2160 hour(s))  POCT urinalysis dipstick     Status: None   Collection Time: 11/05/18  2:49 PM  Result Value Ref Range   Color, UA yellow    Clarity, UA clear    Glucose, UA Negative Negative   Bilirubin, UA neg    Ketones, UA neg    Spec Grav, UA 1.010 1.010 - 1.025   Blood, UA trace-lysed    pH, UA 6.5 5.0 - 8.0   Protein, UA Negative Negative   Urobilinogen, UA 0.2 0.2 or 1.0 E.U./dL   Nitrite, UA neg    Leukocytes, UA Negative Negative   Appearance     Odor    CBC     Status: None   Collection Time: 11/05/18  2:52 PM  Result Value Ref Range   WBC 4.1 3.8 - 10.8 Thousand/uL   RBC 4.98 4.20 - 5.80 Million/uL    Hemoglobin 14.9 13.2 - 17.1 g/dL   HCT 43.9 38.5 - 50.0 %   MCV 88.2 80.0 - 100.0 fL   MCH 29.9 27.0 - 33.0 pg   MCHC 33.9 32.0 - 36.0 g/dL   RDW 13.3 11.0 - 15.0 %   Platelets 288 140 - 400 Thousand/uL   MPV 9.4 7.5 - 12.5 fL  COMPLETE METABOLIC PANEL WITH GFR     Status: Abnormal   Collection Time: 11/05/18  2:52 PM  Result Value Ref Range   Glucose, Bld 124 (H) 65 - 99 mg/dL    Comment: .            Fasting reference interval . For someone without known diabetes, a glucose value between 100 and 125 mg/dL is consistent with prediabetes and should be confirmed with a follow-up test. .    BUN 15 7 - 25 mg/dL   Creat 1.03 0.70 - 1.25 mg/dL    Comment: For patients >88 years of age, the reference limit for Creatinine is approximately 13% higher for people identified as African-American. .    GFR, Est Non African American 74 > OR = 60 mL/min/1.40m2   GFR, Est African American 86 > OR = 60 mL/min/1.53m2   BUN/Creatinine Ratio NOT APPLICABLE 6 - 22 (calc)   Sodium 138 135 -  146 mmol/L   Potassium 4.1 3.5 - 5.3 mmol/L   Chloride 104 98 - 110 mmol/L   CO2 26 20 - 32 mmol/L   Calcium 9.9 8.6 - 10.3 mg/dL   Total Protein 7.4 6.1 - 8.1 g/dL   Albumin 4.6 3.6 - 5.1 g/dL   Globulin 2.8 1.9 - 3.7 g/dL (calc)   AG Ratio 1.6 1.0 - 2.5 (calc)   Total Bilirubin 0.4 0.2 - 1.2 mg/dL   Alkaline phosphatase (APISO) 68 35 - 144 U/L   AST 15 10 - 35 U/L   ALT 15 9 - 46 U/L  Hemoglobin A1c     Status: Abnormal   Collection Time: 11/05/18  2:52 PM  Result Value Ref Range   Hgb A1c MFr Bld 5.7 (H) <5.7 % of total Hgb    Comment: For someone without known diabetes, a hemoglobin  A1c value between 5.7% and 6.4% is consistent with prediabetes and should be confirmed with a  follow-up test. . For someone with known diabetes, a value <7% indicates that their diabetes is well controlled. A1c targets should be individualized based on duration of diabetes, age, comorbid conditions, and other  considerations. . This assay result is consistent with an increased risk of diabetes. . Currently, no consensus exists regarding use of hemoglobin A1c for diagnosis of diabetes for children. .    Mean Plasma Glucose 117 (calc)   eAG (mmol/L) 6.5 (calc)  Lipid Panel w/reflex Direct LDL     Status: Abnormal   Collection Time: 11/05/18  2:52 PM  Result Value Ref Range   Cholesterol 208 (H) <200 mg/dL   HDL 57 > OR = 40 mg/dL   Triglycerides 97 <150 mg/dL   LDL Cholesterol (Calc) 131 (H) mg/dL (calc)    Comment: Reference range: <100 . Desirable range <100 mg/dL for primary prevention;   <70 mg/dL for patients with CHD or diabetic patients  with > or = 2 CHD risk factors. Marland Kitchen LDL-C is now calculated using the Martin-Hopkins  calculation, which is a validated novel method providing  better accuracy than the Friedewald equation in the  estimation of LDL-C.  Cresenciano Genre et al. Annamaria Helling. WG:[id number]): 2061-2068  (http://education.[facility].com/faq/FAQ164)    Total CHOL/HDL Ratio 3.6 <5.0 (calc)   Non-HDL Cholesterol (Calc) 151 (H) <130 mg/dL (calc)    Comment: For patients with diabetes plus 1 major ASCVD risk  factor, treating to a non-HDL-C goal of <100 mg/dL  (LDL-C of <70 mg/dL) is considered a therapeutic  option.   TSH     Status: None   Collection Time: 11/05/18  2:52 PM  Result Value Ref Range   TSH 1.03 0.40 - 4.50 mIU/L  D-dimer, quantitative (not at [facility])     Status: Abnormal   Collection Time: 11/05/18  2:52 PM  Result Value Ref Range   D-Dimer, Quant 0.62 (H) <0.50 mcg/mL FEU    Comment: . The D-Dimer test is used frequently to exclude an acute PE or DVT. In patients with a low to moderate clinical risk assessment and a D-Dimer result <0.50 mcg/mL FEU, the likelihood of a PE or DVT is very low. However, a thromboembolic event should not be excluded solely on the basis of the D-Dimer level. Increased levels of D-Dimer are associated with a PE, DVT, DIC,  malignancies, inflammation, sepsis, surgery, trauma, pregnancy, and advancing patient age. [Jama 2006 11:295(2):199-207] . For additional information, please refer to: http://education.[facility].com/faq/FAQ149 (This link is being provided for informational/ educational purposes only) .  CK total and CKMB (cardiac)not at [facility]     Status: None   Collection Time: 11/05/18  2:52 PM  Result Value Ref Range   Total CK 190 44 - 196 U/L   CK, MB 1.0 0 - 5.0 ng/mL   Relative Index 0.5 0 - 4.0  Troponin I -     Status: None   Collection Time: 11/05/18  2:52 PM  Result Value Ref Range   Troponin I <0.01 < OR = 0.0 ng/mL    Comment: . In accord with published recommendations, serial testing of troponin I at intervals of 2 to 4 hours for up to 12 to 24 hours is suggested in order to corroborate a single troponin I result. An elevated troponin alone is not sufficient to make the diagnosis of MI. . . For additional information, please refer to  http://education.[facility].com/faq/FAQ202  (This link is being provided for informational/ educational purposes only.)       Twelve-lead EKG shows normal sinus rhythm at 81 bpm.  No ST segment elevation or depression.  Slight T wave inversion at V2 not significant change compared to EKG obtained by Dr. Dianah Field on August 18.  QTc 413.   Assessment and Plan: 68 y.o. male with  Chest pain.  Patient has several possible causes of chest pain that might be happening simultaneously.  Esophageal/gastric: Patient describes pain and regurgitation and sensation of not being able to swallow food fully.  He has had evaluation with gastroenterology previously and has had dilation of strictures.  I think this is the most likely cause of his pain and he certainly is having dysphasia and regurgitation symptoms.  Plan to increase omeprazole to 40 mg twice daily and add Carafate.  Continue to avoid NSAIDs.  Informed patient to contact his  gastroenterologist again for sooner appointment but will also place referral now to make sure patient gets in and get reevaluated soon.  Discussed dysphagia diet as well.  Pleural: Patient has known pleural thickening thought to be related to asbestos exposure.  CT scan obtained recently as above does show pleural thickening but no pleural effusion or significant new abnormal changes.  Avoid NSAIDs as above use Tylenol as needed.  If not improving and GI thought not to be the cause will proceed with further evaluation and treatment for this issue.  May benefit from steroids.  May need repeat evaluation with pulmonologist Dr. Michela Pitcher.  Cardiac: Patient does have some abnormalities on EKG that were new on August 18 and unchanged today from August 18, his symptoms are not likely cardiac related however certainly this will benefit from further work-up.  He had negative troponins on the 18th.  Stress test is already arranged.  Proceed with stress testing as this will be informative.  Additionally patient notes continued scrotum swelling that is very likely hydrocele as previously evaluated by urology.  When asked he notes his symptoms are not bad enough to necessitate surgery or invasive treatments.  Plan to proceed with watchful waiting for this issue.  Could proceed with repeat ultrasound if needed however patient does not think that is necessary at this time.  Plan to recheck in 1 month.  Return sooner if needed.  Precautions reviewed with patient.   I spent 40 minutes with this patient, greater than 50% was face-to-face time counseling regarding ddx and plan as above .  We will discuss my transition to sports medicine in November at the follow-up visit in 1 month. PDMP not reviewed this encounter. Orders  Placed This Encounter  Procedures  . Ambulatory referral to Gastroenterology    Referral Priority:   Urgent    Referral Type:   Consultation    Referral Reason:   Specialty Services Required     Referred to Provider:   Lucienne Capers, MD    Number of Visits Requested:   1   Meds ordered this encounter  Medications  . omeprazole (PRILOSEC) 40 MG capsule    Sig: Take 1 capsule (40 mg total) by mouth 2 (two) times daily.    Dispense:  180 capsule    Refill:  3  . sucralfate (CARAFATE) 1 g tablet    Sig: Take 1 tablet (1 g total) by mouth 4 (four) times daily.    Dispense:  120 tablet    Refill:  0     Historical information moved to improve visibility of documentation.  Past Medical History:  Diagnosis Date  . Chronic lung disease    Asbestos  . Hydrocele, right 11/14/2018   Evaluated [facility] 2019.  See scanned document.  . Prediabetes    Past Surgical History:  Procedure Laterality Date  . UMBILICAL HERNIA REPAIR     Social History   Tobacco Use  . Smoking status: Never Smoker  . Smokeless tobacco: Never Used  Substance Use Topics  . Alcohol use: Not Currently    Alcohol/week: 0.0 standard drinks    Comment: Occasional   family history includes Hypertension in his father.  Medications: Current Outpatient Medications  Medication Sig Dispense Refill  . AMBULATORY NON FORMULARY MEDICATION Single glucometer with lancets, test strips. Test daily . Prediabetes 1 each 0  . aspirin EC 81 MG tablet Take 1 tablet (81 mg total) by mouth daily. 90 tablet 3  . diclofenac sodium (VOLTAREN) 1 % GEL APPLY FOUR GRAMS TO THE AFFECTED JOINT FOUR TIMES PER DAY 700 g 12  . fluticasone (FLONASE) 50 MCG/ACT nasal spray Place 1-2 sprays into both nostrils daily. 48 g 12  . gabapentin (NEURONTIN) 300 MG capsule One tab PO qHS for a week, then BID for a week, then TID. May double weekly to a max of 3,600mg /day 180 capsule 3  . glucose blood test strip Please include preferred meter. Check fasting blood sugar daily. Diagnosis Pre diabetes ICD 10 R73.03 100 each prn  . Lancets MISC Check fasting blood sugar daily. Diagnosis Pre diabetes ICD 10 R73.03 100 each prn  .  metoprolol tartrate (LOPRESSOR) 25 MG tablet Take 1 tablet (25 mg total) by mouth 2 (two) times daily. 60 tablet 3  . naproxen (NAPROSYN) 500 MG tablet TAKE 1 TABLET BY MOUTH 2 TIMES DAILY WITH A MEAL prn pain 180 tablet 1  . omeprazole (PRILOSEC) 40 MG capsule Take 1 capsule (40 mg total) by mouth 2 (two) times daily. 180 capsule 3  . tamsulosin (FLOMAX) 0.4 MG CAPS capsule TAKE 1 CAPSULE (0.4 MG TOTAL) BY MOUTH DAILY AFTER BREAKFAST. 90 capsule 1  . triamcinolone cream (KENALOG) 0.1 % Apply 1 application topically 2 (two) times daily. 454 g 12  . sucralfate (CARAFATE) 1 g tablet Take 1 tablet (1 g total) by mouth 4 (four) times daily. 120 tablet 0   No current facility-administered medications for this visit.    No Known Allergies   Discussed warning signs or symptoms. Please see discharge instructions. Patient expresses understanding.

## 2018-11-15 DIAGNOSIS — R1310 Dysphagia, unspecified: Secondary | ICD-10-CM | POA: Diagnosis not present

## 2018-11-15 DIAGNOSIS — R071 Chest pain on breathing: Secondary | ICD-10-CM | POA: Diagnosis not present

## 2018-11-15 DIAGNOSIS — K219 Gastro-esophageal reflux disease without esophagitis: Secondary | ICD-10-CM | POA: Diagnosis not present

## 2018-11-19 ENCOUNTER — Telehealth (HOSPITAL_COMMUNITY): Payer: Self-pay | Admitting: *Deleted

## 2018-11-19 NOTE — Telephone Encounter (Signed)
Patient given detailed instructions per Myocardial Perfusion Study Information Sheet for the test on 11/27/2018 at 0745. Patient notified to arrive 15 minutes early and that it is imperative to arrive on time for appointment to keep from having the test rescheduled.  If you need to cancel or reschedule your appointment, please call the office within 24 hours of your appointment. . Patient verbalized understanding.Britten Parady, [place]anae [place] No mychart available

## 2018-11-27 ENCOUNTER — Ambulatory Visit (HOSPITAL_COMMUNITY): Payer: Medicare HMO | Attending: Sports Medicine

## 2018-11-27 ENCOUNTER — Encounter (HOSPITAL_COMMUNITY): Payer: Self-pay

## 2018-11-27 ENCOUNTER — Other Ambulatory Visit: Payer: Self-pay

## 2018-11-27 VITALS — Ht 68.0 in | Wt 173.0 lb

## 2018-11-27 DIAGNOSIS — R072 Precordial pain: Secondary | ICD-10-CM | POA: Insufficient documentation

## 2018-11-27 DIAGNOSIS — R55 Syncope and collapse: Secondary | ICD-10-CM | POA: Insufficient documentation

## 2018-11-27 MED ORDER — TECHNETIUM TC 99M TETROFOSMIN IV KIT
29.8000 | PACK | Freq: Once | INTRAVENOUS | Status: AC | PRN
  Administered 2018-11-27: 29.8 via INTRAVENOUS
  Filled 2018-11-27: qty 30

## 2018-11-28 ENCOUNTER — Ambulatory Visit (HOSPITAL_COMMUNITY): Payer: Medicare HMO | Attending: Cardiology

## 2018-11-28 ENCOUNTER — Ambulatory Visit (INDEPENDENT_AMBULATORY_CARE_PROVIDER_SITE_OTHER): Payer: Medicare HMO | Admitting: Family Medicine

## 2018-11-28 ENCOUNTER — Ambulatory Visit (INDEPENDENT_AMBULATORY_CARE_PROVIDER_SITE_OTHER): Payer: Medicare HMO

## 2018-11-28 VITALS — BP 120/70 | HR 75 | Temp 98.5°F | Wt 171.0 lb

## 2018-11-28 DIAGNOSIS — R55 Syncope and collapse: Secondary | ICD-10-CM | POA: Insufficient documentation

## 2018-11-28 DIAGNOSIS — M542 Cervicalgia: Secondary | ICD-10-CM | POA: Diagnosis not present

## 2018-11-28 DIAGNOSIS — R413 Other amnesia: Secondary | ICD-10-CM | POA: Diagnosis not present

## 2018-11-28 DIAGNOSIS — G44319 Acute post-traumatic headache, not intractable: Secondary | ICD-10-CM

## 2018-11-28 LAB — MYOCARDIAL PERFUSION IMAGING
LV dias vol: 95 mL (ref 62–150)
LV sys vol: 46 mL
Peak HR: 90 {beats}/min
Rest HR: 62 {beats}/min
SDS: 1
SRS: 0
SSS: 1
TID: 1.04

## 2018-11-28 MED ORDER — TECHNETIUM TC 99M TETROFOSMIN IV KIT
33.0000 | PACK | Freq: Once | INTRAVENOUS | Status: AC | PRN
  Administered 2018-11-28: 33 via INTRAVENOUS
  Filled 2018-11-28: qty 33

## 2018-11-28 MED ORDER — REGADENOSON 0.4 MG/5ML IV SOLN
0.4000 mg | Freq: Once | INTRAVENOUS | Status: AC
  Administered 2018-11-28: 0.4 mg via INTRAVENOUS

## 2018-11-28 NOTE — Patient Instructions (Addendum)
Thank you for coming in today. Get xray of the neck today and brain MRI in the next few days.  Assuming no suprizes next treatment for neck pain is physical therapy.  I will place an order.  Recheck in 1 month to follow up memory and neck and head pain and chest pain issue.   I will be moving to full time Sports Medicine in [place] starting on November 1st.  You will still be able to see me for your Sports Medicine or Orthopedic needs at [facility] in [place]. I will still be part of [place].    If you want to stay locally for your Sports Medicine issues Dr. Dianah Field here in [place] will be happy to see you.  Additionally Dr. Clearance Coots at [facility] will be happy to see you for sports medicine issues more locally.   For your primary care needs you are welcome to establish care with Dr. Emeterio Reeve.  We are working quickly to hire more physicians to cover the primary care needs however if you cannot get an appointment with Dr. Sheppard Coil in a timely manner [place] has locations and openings for primary care services nearby.   [facility] at [facility] [address] . [employer] [place] , [place]: [phone] . Behavioral Medicine: [phone] . Fax: [place] at [employer] [address] . [place], [place]: [phone] . Behavioral Medicine: [phone] . Fax: [phone] . Hours (M-F): 7am - Academic librarian At [facility]. [place], [place]: [phone] . Behavioral Medicine: [phone] . Fax: [phone] . Hours (M-F): 8am - Optician, dispensing at [employer] . [place], [place] Phone: [phone] . Behavioral Medicine: [phone] . Fax: [phone]

## 2018-11-28 NOTE — Progress Notes (Signed)
Michael Gallegos is a 68 y.o. male who presents to [facility]: [place] today for neck pain and headache.  Around about 4 to 5 weeks ago Michael Gallegos was on his riding lawnmower approaching a overhanging branch.  He pushed the branch out of his way but it recoiled and hit him in the right side of his head and neck.  He has had right lateral neck pain and head pain since.  He is also hit his head a few times in the crawl space in his house.  He notes pain located in the right lateral neck with some electrical shocking sensation into the right temporal scalp.  He notes his pain is improving a bit but still quite persistent.  He denies any weakness or numbness or loss of function.  His wife notes that his memory is a little worse than usual over the last month or so.  She denies any significant change in personality behavior or abilities.  Additionally patient was recently seen for chest pain thought to be probably related to dysphasia.  He has had some evaluation already and is feeling a lot better now.    ROS as above:  Exam:  BP 120/70   Pulse 75   Temp 98.5 F (36.9 C) (Oral)   Wt 171 lb (77.6 kg)   SpO2 99%   BMI 26.00 kg/m  Wt Readings from Last 5 Encounters:  11/28/18 171 lb (77.6 kg)  11/27/18 173 lb (78.5 kg)  11/14/18 173 lb (78.5 kg)  11/05/18 175 lb (79.4 kg)  04/30/18 178 lb (80.7 kg)    Gen: Well NAD HEENT: EOMI,  MMM Lungs: Normal work of breathing. CTABL Heart: RRR no MRG Abd: NABS, Soft. Nondistended, Nontender Exts: Brisk capillary refill, warm and well perfused.  C-spine: Nontender to spinal midline.  Tender palpation right trapezius and cervical paraspinal musculature. Cervical motion is intact except for left lateral rotation and left lateral flexion which is somewhat limited. Upper extremity strength reflexes and sensation are equal normal throughout  bilateral upper extremities. Neuro: Alert and oriented normal coordination balance and gait.  Normal speech and thought process.    Lab and Radiology Results X-ray C-spine images obtained today personally independently reviewed. Diffuse degenerative changes with no acute fractures or malalignment. Await formal radiology review.  MRI brain report and images from April 2019 reviewed.  Assessment and Plan: 68 y.o. male with neck pain and headache and memory change following head impact injury occurring about a month ago.  Patient's exam is more consistent with myofascial strain and dysfunction than anything else however his history and exam is concerning enough that it is reasonable to proceed with further evaluation.  Plan for x-ray C-spine as noted above.  Radiology review is pending.  Also obtain noncontrast CT scan head to evaluate for significant intracranial etiology.  Check back in 1 month.  If not improving will proceed with further work-up and evaluation.  Precautions reviewed.  PDMP not reviewed this encounter. Orders Placed This Encounter  Procedures  . DG Cervical Spine Complete    Standing Status:   Future    Number of Occurrences:   1    Standing Expiration Date:   01/28/2020    Order Specific Question:   Reason for Exam (SYMPTOM  OR DIAGNOSIS REQUIRED)    Answer:   eval neck pain after [place] 1 month ago right side.    Order Specific Question:   Preferred imaging location?  Answer:   [facility]    Order Specific Question:   Radiology Contrast Protocol - do NOT remove file path    Answer:   \\[email or web address].[email or web address]  . CT Head Wo Contrast    Standing Status:   Future    Standing Expiration Date:   02/27/2020    Order Specific Question:   Preferred imaging location?    Answer:   [facility]    Order Specific Question:   Radiology Contrast Protocol - do NOT remove file path    Answer:    \\[email or web address]   No orders of the defined types were placed in this encounter.    Historical information moved to improve visibility of documentation.  Past Medical History:  Diagnosis Date  . Chronic lung disease    Asbestos  . Hydrocele, right 11/14/2018   Evaluated [facility] 2019.  See scanned document.  . Prediabetes    Past Surgical History:  Procedure Laterality Date  . UMBILICAL HERNIA REPAIR     Social History   Tobacco Use  . Smoking status: Never Smoker  . Smokeless tobacco: Never Used  Substance Use Topics  . Alcohol use: Not Currently    Alcohol/week: 0.0 standard drinks    Comment: Occasional   family history includes Hypertension in his father.  Medications: Current Outpatient Medications  Medication Sig Dispense Refill  . AMBULATORY NON FORMULARY MEDICATION Single glucometer with lancets, test strips. Test daily . Prediabetes 1 each 0  . aspirin EC 81 MG tablet Take 1 tablet (81 mg total) by mouth daily. 90 tablet 3  . diclofenac sodium (VOLTAREN) 1 % GEL APPLY FOUR GRAMS TO THE AFFECTED JOINT FOUR TIMES PER DAY 700 g 12  . fluticasone (FLONASE) 50 MCG/ACT nasal spray Place 1-2 sprays into both nostrils daily. 48 g 12  . gabapentin (NEURONTIN) 300 MG capsule One tab PO qHS for a week, then BID for a week, then TID. May double weekly to a max of 3,600mg /day 180 capsule 3  . glucose blood test strip Please include preferred meter. Check fasting blood sugar daily. Diagnosis Pre diabetes ICD 10 R73.03 100 each prn  . Lancets MISC Check fasting blood sugar daily. Diagnosis Pre diabetes ICD 10 R73.03 100 each prn  . metoprolol tartrate (LOPRESSOR) 25 MG tablet Take 1 tablet (25 mg total) by mouth 2 (two) times daily. 60 tablet 3  . naproxen (NAPROSYN) 500 MG tablet TAKE 1 TABLET BY MOUTH 2 TIMES DAILY WITH A MEAL prn pain 180 tablet 1  . omeprazole (PRILOSEC) 40 MG capsule Take 1 capsule (40 mg total) by mouth 2 (two) times  daily. 180 capsule 3  . sucralfate (CARAFATE) 1 g tablet Take 1 tablet (1 g total) by mouth 4 (four) times daily. 120 tablet 0  . tamsulosin (FLOMAX) 0.4 MG CAPS capsule TAKE 1 CAPSULE (0.4 MG TOTAL) BY MOUTH DAILY AFTER BREAKFAST. 90 capsule 1  . triamcinolone cream (KENALOG) 0.1 % Apply 1 application topically 2 (two) times daily. 454 g 12   No current facility-administered medications for this visit.    No Known Allergies   Discussed warning signs or symptoms. Please see discharge instructions. Patient expresses understanding.

## 2018-12-03 ENCOUNTER — Other Ambulatory Visit: Payer: Self-pay | Admitting: Family Medicine

## 2018-12-03 MED ORDER — TRIAMCINOLONE ACETONIDE 0.1 % EX CREA: 1.0000 "application " | TOPICAL_CREAM | Freq: Two times a day (BID) | CUTANEOUS | 12 refills | Status: AC

## 2018-12-03 MED ORDER — OMEPRAZOLE 40 MG PO CPDR: 40.0000 mg | DELAYED_RELEASE_CAPSULE | Freq: Two times a day (BID) | ORAL | 3 refills | Status: DC

## 2018-12-06 ENCOUNTER — Other Ambulatory Visit: Payer: Self-pay | Admitting: Family Medicine

## 2018-12-06 DIAGNOSIS — K7689 Other specified diseases of liver: Secondary | ICD-10-CM | POA: Diagnosis not present

## 2018-12-06 DIAGNOSIS — R071 Chest pain on breathing: Secondary | ICD-10-CM | POA: Diagnosis not present

## 2018-12-09 ENCOUNTER — Other Ambulatory Visit: Payer: Medicare HMO

## 2018-12-10 ENCOUNTER — Other Ambulatory Visit: Payer: Medicare HMO

## 2018-12-11 ENCOUNTER — Other Ambulatory Visit: Payer: Medicare HMO

## 2018-12-11 ENCOUNTER — Ambulatory Visit (INDEPENDENT_AMBULATORY_CARE_PROVIDER_SITE_OTHER): Payer: Medicare HMO

## 2018-12-11 ENCOUNTER — Other Ambulatory Visit: Payer: Self-pay

## 2018-12-11 DIAGNOSIS — G44319 Acute post-traumatic headache, not intractable: Secondary | ICD-10-CM

## 2018-12-11 DIAGNOSIS — R51 Headache: Secondary | ICD-10-CM | POA: Diagnosis not present

## 2018-12-12 ENCOUNTER — Ambulatory Visit: Payer: Medicare HMO | Admitting: Family Medicine

## 2018-12-12 ENCOUNTER — Ambulatory Visit (INDEPENDENT_AMBULATORY_CARE_PROVIDER_SITE_OTHER): Payer: Medicare HMO

## 2018-12-12 ENCOUNTER — Ambulatory Visit (INDEPENDENT_AMBULATORY_CARE_PROVIDER_SITE_OTHER): Payer: Medicare HMO | Admitting: Family Medicine

## 2018-12-12 ENCOUNTER — Encounter: Payer: Self-pay | Admitting: Family Medicine

## 2018-12-12 VITALS — BP 122/79 | HR 84 | Temp 98.2°F | Wt 174.0 lb

## 2018-12-12 DIAGNOSIS — G44319 Acute post-traumatic headache, not intractable: Secondary | ICD-10-CM | POA: Diagnosis not present

## 2018-12-12 DIAGNOSIS — M25561 Pain in right knee: Secondary | ICD-10-CM

## 2018-12-12 DIAGNOSIS — M170 Bilateral primary osteoarthritis of knee: Secondary | ICD-10-CM

## 2018-12-12 DIAGNOSIS — N433 Hydrocele, unspecified: Secondary | ICD-10-CM

## 2018-12-12 DIAGNOSIS — M1711 Unilateral primary osteoarthritis, right knee: Secondary | ICD-10-CM | POA: Diagnosis not present

## 2018-12-12 DIAGNOSIS — G8929 Other chronic pain: Secondary | ICD-10-CM

## 2018-12-12 DIAGNOSIS — M1712 Unilateral primary osteoarthritis, left knee: Secondary | ICD-10-CM | POA: Diagnosis not present

## 2018-12-12 NOTE — Progress Notes (Signed)
Michael Gallegos is a 68 y.o. male who presents to [facility]: [place] today for follow up chest and neck pain, right scrotal swelling, and head pain.   Neck Pain -  neck pain following lawn mower injury 6-7 weeks ago has since resolved with no recurrence.   Chest and abdomen pain have resolved.  Doing well no issues.  Right scrotal swelling - Patient has a right -sided testicular hydrocele.  Previously evaluated [facility] with ultrasound.  He reports recent increased swelling in the past two weeks and feelings of "fullness" and some pain in the right testicular area. Patient does not feel that the pain is bad enough to require surgical intervention.  No change in urination or bowel movement.  Head Pain - Patient has right sided temporal head pain with onset about a 1 week after the lawn mower injury. He states that he hit his head on the cross beam of his house around the time of that injury. The pain is mostly localized to the right side of his head and will sometimes radiate to his forehead. The pain is mostly headache-like in nature but there is some pain surrounding the outline of his ear. He uses ibuprofen which relieves the pain. He also states he has some ongoing sinus problems that occur alongside the headache. He reports watery eyes, runny nose, and sneezing. He denies n/v, facial pressure, coughing, fever, chills, sweating.   Knee Pain - Patient reports increased pain in right knee.  He has had history of steroid injection in right knee which provided pain relief lasting about 6 months.  He has been told that he has DJD in the past.  He denies any recent or increased injury.  No swelling locking or catching.  He has tried diclofenac gel which has helped.  He would like to avoid steroid injection if possible because it was somewhat painful during the procedure.  ROS as  above:   Exam:  BP 122/79   Pulse 84   Temp 98.2 F (36.8 C) (Oral)   Wt 174 lb (78.9 kg)   BMI 26.46 kg/m  Wt Readings from Last 5 Encounters:  12/12/18 174 lb (78.9 kg)  11/28/18 171 lb (77.6 kg)  11/27/18 173 lb (78.5 kg)  11/14/18 173 lb (78.5 kg)  11/05/18 175 lb (79.4 kg)    Gen: Well NAD HEENT: EOMI,  MMM. TM and ear canal clear bilaterally.  Lungs: Normal work of breathing. Abd: Does not appear distended.  Exts: Warm and well perfused.  Gu: No inguinal lymphadenopathy.  Normal-appearing penis without lesion. Testicles descended bilaterally. Testicles themselves are normal size with no masses and nontender. Right-sided hydrocele/varicocele present nontender. No inguinal hernia present bilaterally. Neuro alert and oriented normal coordination and balance and gait.  Lab and Radiology Results No results found for this or any previous visit (from the past 72 hour(s)). Ct Head Wo Contrast  Result Date: 12/11/2018 CLINICAL DATA:  Head trauma, headache. Additional history provided: approximately 4-5 weeks ago hit in head and neck by approaching tree branch while mowing lawn EXAM: EXAM CT HEAD WITHOUT CONTRAST TECHNIQUE: Contiguous axial images were obtained from the base of the skull through the vertex without intravenous contrast. COMPARISON:  Brain MRI 07/02/2017 FINDINGS: Brain: No evidence of acute intracranial hemorrhage. No demarcated cortical infarction. No evidence of intracranial mass. No midline shift or extra-axial fluid collection. Cerebral volume is normal for age. Partially empty sella turcica. Vascular: No  hyperdense vessel. Skull: No calvarial fracture. Sinuses/Orbits: Visualized orbits demonstrate no acute abnormality. Visualized paranasal sinuses are clear. Visualized mastoid air cells are clear. IMPRESSION: No evidence acute intracranial abnormality. Electronically Signed   By: Kellie Simmering   On: 12/11/2018 18:12  I personally (independently) visualized and  performed the interpretation of the images attached in this note.  X-ray images right knee obtained today personally independently reviewed. 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 No acute fractures. Await for etiology review  Assessment and Plan: 68 y.o. male with   Right sided headache, knee pain right scrotal swelling. CT scan of head did not show any focal findings or evidence of intracranial bleed.  Pain is improving spontaneously.  Plan for watchful waiting.   Knee pain: Likely DJD. Will order XR of knee to evaluate knee pain. Follow up with insurance to authorize gel injections of the knee. Patient will continue using diclofenac gel as needed for pain.    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  Will also order ultrasound of scrotum to evaluate hydrocele and varicocele. Will monitor scrotal swelling and pain and consider urology consult if issue worsens.    PDMP not reviewed this encounter. Orders Placed This Encounter  Procedures  . DG Knee Complete 4 Views Right    Please include patellar sunrise, lateral, and weightbearing bilateral AP and bilateral rosenberg views    Standing Status:   Future    Number of Occurrences:   1    Standing Expiration Date:   02/11/2020    Order Specific Question:   Reason for exam:    Answer:   Please include patellar sunrise, lateral, and weightbearing bilateral AP and bilateral rosenberg views    Comments:   Please include patellar sunrise, lateral, and weightbearing bilateral AP and bilateral rosenberg views    Order Specific Question:   Preferred imaging location?    Answer:   [facility]  . DG Knee 1-2 Views Left    Standing Status:   Future    Number of Occurrences:   1    Standing Expiration Date:   02/12/2020    Order Specific Question:   Reason for Exam (SYMPTOM  OR DIAGNOSIS REQUIRED)    Answer:   For use with right knee x-ray, bilateral AP and  Rosenberg standing.    Order Specific Question:   Preferred imaging location?    Answer:   [facility]  . US SCROTUM    Standing Status:   Future    Number of Occurrences:   1    Standing Expiration Date:   02/11/2020    Order Specific Question:   Reason for Exam (SYMPTOM  OR DIAGNOSIS REQUIRED)    Answer:   For use with right knee x-ray, bilateral AP and Rosenberg standing.    Order Specific Question:   Preferred imaging location?    Answer:   [facility]   No orders of the defined types were placed in this encounter.    Historical information moved to improve visibility of documentation.  Past Medical History:  Diagnosis Date  . Chronic lung disease    Asbestos  . Hydrocele, right 11/14/2018   [facility] 2019.  See scanned document.  . Prediabetes    Past Surgical History:  Procedure Laterality Date  . UMBILICAL HERNIA REPAIR     Social History   Tobacco Use  . Smoking status: Never Smoker  . Smokeless tobacco: Never Used  Substance Use Topics  . Alcohol use: Not Currently    Alcohol/week: 0.0 standard drinks    Comment: Occasional   family history includes Hypertension in his father.  Medications: Current Outpatient Medications  Medication Sig Dispense Refill  . AMBULATORY NON FORMULARY MEDICATION Single glucometer with lancets, test strips. Test daily . Prediabetes 1 each 0  . aspirin EC 81 MG tablet Take 1 tablet (81 mg total) by mouth daily. 90 tablet 3  . diclofenac sodium (VOLTAREN) 1 % GEL APPLY FOUR GRAMS TO THE AFFECTED JOINT FOUR TIMES PER DAY 700 g 12  . fluticasone (FLONASE) 50 MCG/ACT nasal spray Place 1-2 sprays into both nostrils daily. 48 g 12  . gabapentin (NEURONTIN) 300 MG capsule One tab PO qHS for a week, then BID for a week, then TID. May double weekly to a max of 3,600mg /day 180 capsule 3  . glucose blood test strip Please include preferred meter. Check fasting blood sugar daily. Diagnosis Pre diabetes ICD  10 R73.03 100 each prn  . Lancets MISC Check fasting blood sugar daily. Diagnosis Pre diabetes ICD 10 R73.03 100 each prn  . metoprolol tartrate (LOPRESSOR) 25 MG tablet Take 1 tablet (25 mg total) by mouth 2 (two) times daily. 60 tablet 3  . naproxen (NAPROSYN) 500 MG tablet TAKE 1 TABLET BY MOUTH 2 TIMES DAILY WITH A MEAL prn pain 180 tablet 1  . omeprazole (PRILOSEC) 40 MG capsule Take 1 capsule (40 mg total) by mouth 2 (two) times daily. 180 capsule 3  . sucralfate (CARAFATE) 1 g tablet TAKE 1 TABLET (1 G TOTAL) BY MOUTH 4 (FOUR) TIMES DAILY. 120 tablet 0  . tamsulosin (FLOMAX) 0.4 MG CAPS capsule TAKE 1 CAPSULE (0.4 MG TOTAL) BY MOUTH DAILY AFTER BREAKFAST. 90 capsule 1  . triamcinolone cream (KENALOG) 0.1 % Apply 1 application topically 2 (two) times daily. 454 g 12   No current facility-administered medications for this visit.    No Known Allergies   Discussed warning signs or symptoms. Please see discharge instructions. Patient expresses understanding.  I personally was present and performed or re-performed the history, physical exam and medical decision-making activities of this service and have verified that the service and findings are accurately documented in the student's note. ___________________________________________ Lynne Leader M.D., ABFM., CAQSM. Primary Care and Sports Medicine Adjunct Instructor of [employer] [facility] [facility] [employer]

## 2018-12-12 NOTE — Patient Instructions (Signed)
Thank you for coming in today.  Get xray of knee and ultrasound of scrotum.  We will work to authorize the gel shots.  We will let you know.  Return if we are doing the gel shots.  Use the diclofeanc gel on the knee

## 2018-12-16 ENCOUNTER — Telehealth: Payer: Self-pay | Admitting: Family Medicine

## 2018-12-16 NOTE — Telephone Encounter (Signed)
-----   Message from Tasia Catchings, [place] sent at 12/16/2018 11:30 AM EDT ----- Regarding: FW: Prior Auth [employer] or equivalent Information has been submitted to [employer] and awaiting determination.   ----- Message ----- From: Gregor Hams, MD Sent: 12/12/2018  12:05 PM EDT To: Tasia Catchings, CMA Subject: Prior Auth [employer] or equivalent             Please work to prior [employer] or equivalent for right knee DJD.

## 2018-12-17 NOTE — Telephone Encounter (Signed)
Benefits delivered. [employer] benefits delivered. Further assistance not requested. Closing case. I have left patient a message to call back about his part of the injections.

## 2018-12-18 NOTE — Telephone Encounter (Signed)
I called the patient and he does not want to proceed with the knee injections at this time.

## 2018-12-23 NOTE — Addendum Note (Signed)
Addended by: Lavon Paganini on: 12/23/2018 03:33 PM   Modules accepted: Orders

## 2018-12-26 ENCOUNTER — Other Ambulatory Visit: Payer: Self-pay | Admitting: Family Medicine

## 2019-01-26 ENCOUNTER — Other Ambulatory Visit: Payer: Self-pay | Admitting: Sports Medicine

## 2019-01-26 DIAGNOSIS — R072 Precordial pain: Secondary | ICD-10-CM

## 2019-04-28 NOTE — Progress Notes (Deleted)
Subjective:   Michael Gallegos is a 69 y.o. male who presents for Medicare Annual/Subsequent preventive examination.  Review of Systems:  No ROS.  Medicare Wellness Virtual Visit.  Visual/audio telehealth visit, UTA vital signs.   See social history for additional risk factors.       Sleep patterns:    Home Safety/Smoke Alarms: Feels safe in home. Smoke alarms in place.  Living environment;  Seat Belt Safety/Bike Helmet: Wears seat belt.   Male:   CCS-  UTD   PSA-  Lab Results  Component Value Date   PSA 0.7 04/18/2018   PSA 0.4 02/08/2016        Objective:    Vitals: There were no vitals taken for this visit.  There is no height or weight on file to calculate BMI.  Advanced Directives 04/30/2018  Does Patient Have a Medical Advance Directive? No  Would patient like information on creating a medical advance directive? Yes (MAU/Ambulatory/Procedural Areas - Information given)    Tobacco Social History   Tobacco Use  Smoking Status Never Smoker  Smokeless Tobacco Never Used     Counseling given: Not Answered   Clinical Intake:                       Past Medical History:  Diagnosis Date  . Chronic lung disease    Asbestos  . Hydrocele, right 11/14/2018   [facility]  See scanned document.  . Prediabetes    Past Surgical History:  Procedure Laterality Date  . UMBILICAL HERNIA REPAIR     Family History  Problem Relation Age of Onset  . Hypertension Father   . Heart disease Neg Hx    Social History   Socioeconomic History  . Marital status: Married    Spouse name: [name]  . Number of children: 7  . Years of education: 84  . Highest education level: 11th grade  Occupational History  . Occupation: Architect- Psychiatrist    Comment: retired  Tobacco Use  . Smoking status: Never Smoker  . Smokeless tobacco: Never Used  Substance and Sexual Activity  . Alcohol use: Not Currently    Alcohol/week: 0.0 standard  drinks    Comment: Occasional  . Drug use: No  . Sexual activity: Yes  Other Topics Concern  . Not on file  Social History Narrative   Has joined the [facility] and is going to start going to the gym.Coffee and ginger tea daily   Social Determinants of Health   Financial Resource Strain: Low Risk   . Difficulty of Paying Living Expenses: Not hard at all  Food Insecurity: No Food Insecurity  . Worried About Charity fundraiser in the Last Year: Never true  . Ran Out of Food in the Last Year: Never true  Transportation Needs: No Transportation Needs  . Lack of Transportation (Medical): No  . Lack of Transportation (Non-Medical): No  Physical Activity: Inactive  . Days of Exercise per Week: 0 days  . Minutes of Exercise per Session: 0 min  Stress: No Stress Concern Present  . Feeling of Stress : Not at all  Social Connections: Slightly Isolated  . Frequency of Communication with Friends and Family: Three times a week  . Frequency of Social Gatherings with Friends and Family: Once a week  . Attends Religious Services: 1 to 4 times per year  . Active Member of Clubs or Organizations: No  . Attends Archivist Meetings:  Never  . Marital Status: Married    Outpatient Encounter Medications as of 05/05/2019  Medication Sig  . AMBULATORY NON FORMULARY MEDICATION Single glucometer with lancets, test strips. Test daily . Prediabetes  . aspirin EC 81 MG tablet Take 1 tablet (81 mg total) by mouth daily.  . diclofenac sodium (VOLTAREN) 1 % GEL APPLY FOUR GRAMS TO THE AFFECTED JOINT FOUR TIMES PER DAY  . fluticasone (FLONASE) 50 MCG/ACT nasal spray Place 1-2 sprays into both nostrils daily.  Marland Kitchen gabapentin (NEURONTIN) 300 MG capsule One tab PO qHS for a week, then BID for a week, then TID. May double weekly to a max of 3,600mg /day  . glucose blood test strip Please include preferred meter. Check fasting blood sugar daily. Diagnosis Pre diabetes ICD 10 R73.03  . Lancets MISC Check fasting  blood sugar daily. Diagnosis Pre diabetes ICD 10 R73.03  . metoprolol tartrate (LOPRESSOR) 25 MG tablet TAKE 1 TABLET BY MOUTH TWICE A DAY  . naproxen (NAPROSYN) 500 MG tablet TAKE 1 TABLET BY MOUTH 2 TIMES DAILY WITH A MEAL prn pain  . omeprazole (PRILOSEC) 40 MG capsule Take 1 capsule (40 mg total) by mouth 2 (two) times daily.  . sucralfate (CARAFATE) 1 g tablet TAKE 1 TABLET (1 G TOTAL) BY MOUTH 4 (FOUR) TIMES DAILY.  . tamsulosin (FLOMAX) 0.4 MG CAPS capsule TAKE 1 CAPSULE (0.4 MG TOTAL) BY MOUTH DAILY AFTER BREAKFAST.  Marland Kitchen triamcinolone cream (KENALOG) 0.1 % Apply 1 application topically 2 (two) times daily.   No facility-administered encounter medications on file as of 05/05/2019.    Activities of Daily Living In your present state of health, do you have any difficulty performing the following activities: 04/30/2018  Hearing? N  Vision? N  Difficulty concentrating or making decisions? N  Walking or climbing stairs? N  Dressing or bathing? N  Doing errands, shopping? N  Preparing Food and eating ? N  Using the Toilet? N  In the past six months, have you accidently leaked urine? N  Do you have problems with loss of bowel control? N  Managing your Medications? N  Managing your Finances? N  Housekeeping or managing your Housekeeping? N  Some recent data might be hidden    Patient Care Team: Gregor Hams, MD as PCP - General (Family Medicine) Lucienne Capers, MD as Referring Physician (Internal Medicine) Ceasar Mons, MD as Consulting Physician (Urology) Dionne Milo, MD as Referring Physician (Pulmonary Disease)   Assessment:   This is a routine wellness examination for [name].Physical assessment deferred to PCP.   Exercise Activities and Dietary recommendations   Diet  Breakfast: Lunch:  Dinner:       Goals    . Exercise 150 min/wk Moderate Activity     Start working out at [employer] at least 3 times a week for at least 30 minutes a day       Fall  Risk Fall Risk  04/30/2018 06/22/2017 05/12/2016  Falls in the past year? 0 No No  Follow up Falls prevention discussed - -   Is the patient's home free of loose throw rugs in walkways, pet beds, electrical cords, etc?   {Blank single:19197::"yes","no"}      Grab bars in the bathroom? {Blank single:19197::"yes","no"}      Handrails on the stairs?   {Blank single:19197::"yes","no"}      Adequate lighting?   {Blank single:19197::"yes","no"}   Depression Screen PHQ 2/9 Scores 04/30/2018 06/22/2017 05/12/2016  PHQ - 2 Score 0 0 0  Cognitive Function     6CIT Screen 04/30/2018  What Year? 0 points  What month? 0 points  What time? 3 points  Count back from 20 0 points  Months in reverse 0 points  Repeat phrase 2 points  Total Score 5     There is no immunization history on file for this patient.   Screening Tests Health Maintenance  Topic Date Due  . URINE MICROALBUMIN  06/29/1960  . PNA vac Low Risk Adult (1 of 2 - PCV13) 06/30/2015  . COLON CANCER SCREENING ANNUAL FOBT  01/08/2018  . INFLUENZA VACCINE  10/19/2018  . TETANUS/TDAP  05/12/2021 (Originally 06/29/1969)  . COLONOSCOPY  01/09/2027  . Hepatitis C Screening  Completed      Plan:   ***  I have personally reviewed and noted the following in the patient's chart:   . Medical and social history . Use of alcohol, tobacco or illicit drugs  . Current medications and supplements . Functional ability and status . Nutritional status . Physical activity . Advanced directives . List of other physicians . Hospitalizations, surgeries, and ER visits in previous 12 months . Vitals . Screenings to include cognitive, depression, and falls . Referrals and appointments  In addition, I have reviewed and discussed with patient certain preventive protocols, quality metrics, and best practice recommendations. A written personalized care plan for preventive services as well as general preventive health recommendations were provided  to patient.     Joanne Chars, LPN  [id number]

## 2019-05-02 ENCOUNTER — Other Ambulatory Visit: Payer: Self-pay | Admitting: Family Medicine

## 2019-05-02 MED ORDER — TAMSULOSIN HCL 0.4 MG PO CAPS: ORAL_CAPSULE | ORAL | 0 refills | Status: DC

## 2019-05-05 ENCOUNTER — Ambulatory Visit: Payer: Self-pay

## 2019-05-07 ENCOUNTER — Ambulatory Visit: Payer: Self-pay | Admitting: Family Medicine

## 2019-05-28 ENCOUNTER — Other Ambulatory Visit: Payer: Self-pay | Admitting: Family Medicine

## 2019-06-12 ENCOUNTER — Encounter: Payer: Self-pay | Admitting: Family Medicine

## 2019-06-12 ENCOUNTER — Other Ambulatory Visit: Payer: Self-pay

## 2019-06-12 ENCOUNTER — Ambulatory Visit (INDEPENDENT_AMBULATORY_CARE_PROVIDER_SITE_OTHER): Payer: Medicare Other | Admitting: Family Medicine

## 2019-06-12 DIAGNOSIS — J920 Pleural plaque with presence of asbestos: Secondary | ICD-10-CM

## 2019-06-12 DIAGNOSIS — R351 Nocturia: Secondary | ICD-10-CM

## 2019-06-12 DIAGNOSIS — M1711 Unilateral primary osteoarthritis, right knee: Secondary | ICD-10-CM

## 2019-06-12 DIAGNOSIS — R202 Paresthesia of skin: Secondary | ICD-10-CM | POA: Diagnosis not present

## 2019-06-12 DIAGNOSIS — R072 Precordial pain: Secondary | ICD-10-CM | POA: Diagnosis not present

## 2019-06-12 DIAGNOSIS — I10 Essential (primary) hypertension: Secondary | ICD-10-CM

## 2019-06-12 DIAGNOSIS — N401 Enlarged prostate with lower urinary tract symptoms: Secondary | ICD-10-CM

## 2019-06-12 MED ORDER — GABAPENTIN 300 MG PO CAPS: ORAL_CAPSULE | ORAL | 3 refills | Status: DC

## 2019-06-12 MED ORDER — METOPROLOL TARTRATE 25 MG PO TABS: 25.0000 mg | ORAL_TABLET | Freq: Two times a day (BID) | ORAL | 1 refills | Status: DC

## 2019-06-12 MED ORDER — NAPROXEN 500 MG PO TABS: ORAL_TABLET | ORAL | 1 refills | Status: DC

## 2019-06-12 MED ORDER — TAMSULOSIN HCL 0.4 MG PO CAPS: ORAL_CAPSULE | ORAL | 1 refills | Status: DC

## 2019-06-12 NOTE — Assessment & Plan Note (Signed)
Blood pressure is at goal at for age and co-morbidities.  I recommend he continue metoprolol at current strength.  In addition they were instructed to follow a low sodium diet with regular exercise to help to maintain adequate control of blood pressure.

## 2019-06-12 NOTE — Assessment & Plan Note (Signed)
Has had injection without much improvement previously. Continue naproxen and topical diclofenac.  Recommend scheduling with Dr. Dianah Field to discuss possibly repeating steroid injection vs visco-supplementation

## 2019-06-12 NOTE — Assessment & Plan Note (Signed)
Well controlled with tamsulosin, continue.

## 2019-06-12 NOTE — Progress Notes (Signed)
Michael Gallegos - 69 y.o. male MRN [id number]  Date of birth: [birth year]  Subjective Chief Complaint  Patient presents with  . Hypertension    HPI Michael Gallegos is a 69 y.o. male with history of HTN, asbestosis, Prediabetes, BPH and OA here today for follow visit .  -HTN:  Current management with lopressor 25mg .  He is doing well with this and denies side effects including symptoms hypotension.  He does not monitor BP at home.  He denies chest pain, shortness of breath, palpitations, headache or vision changes  -BPH:  Symptoms are well managed with tamsulosin.  Last PSA 0.7.   -Asbestos exposure:  Previous occupational exposure to asbestos while working in [place] in [place].  He is followed by pulmonology [facility].  Denies increased shortness of breath, cough or changes in weight.   -OA:  Complains of chronic knee and back pain.  Previous xrays with degenerative changes of both knees.  Pain in R knee is worst.  He is using diclofenac gel and naproxen as needed.  He has had injection with only minimal improvement.  He does have intermittent swelling.  He denies locking sensation.    ROS:  A comprehensive ROS was completed and negative except as noted per HPI  No Known Allergies  Past Medical History:  Diagnosis Date  . Chronic lung disease    Asbestos  . Hydrocele, right 11/14/2018   [facility]  See scanned document.  . Prediabetes     Past Surgical History:  Procedure Laterality Date  . UMBILICAL HERNIA REPAIR      Social History   Socioeconomic History  . Marital status: Married    Spouse name: [name]  . Number of children: 7  . Years of education: 28  . Highest education level: 11th grade  Occupational History  . Occupation: Architect- Psychiatrist    Comment: retired  Tobacco Use  . Smoking status: Never Smoker  . Smokeless tobacco: Never Used  Substance and Sexual Activity  . Alcohol use: Not Currently    Alcohol/week:  0.0 standard drinks    Comment: Occasional  . Drug use: No  . Sexual activity: Yes  Other Topics Concern  . Not on file  Social History Narrative   Has joined the [facility] [employer] and is going to start going to the gym.Coffee and ginger tea daily   Social Determinants of Health   Financial Resource Strain:   . Difficulty of Paying Living Expenses:   Food Insecurity:   . Worried About Charity fundraiser in the Last Year:   . Arboriculturist in the Last Year:   Transportation Needs:   . Film/video editor (Medical):   Marland Kitchen Lack of Transportation (Non-Medical):   Physical Activity:   . Days of Exercise per Week:   . Minutes of Exercise per Session:   Stress:   . Feeling of Stress :   Social Connections:   . Frequency of Communication with Friends and Family:   . Frequency of Social Gatherings with Friends and Family:   . Attends Religious Services:   . Active Member of Clubs or Organizations:   . Attends Archivist Meetings:   Marland Kitchen Marital Status:     Family History  Problem Relation Age of Onset  . Hypertension Father   . Heart disease Neg Hx     Health Maintenance  Topic Date Due  . URINE MICROALBUMIN  Never done  . PNA vac Low  Risk Adult (1 of 2 - PCV13) Never done  . COLON CANCER SCREENING ANNUAL FOBT  01/08/2018  . INFLUENZA VACCINE  Never done  . TETANUS/TDAP  05/12/2021 (Originally 06/29/1969)  . COLONOSCOPY  01/09/2027  . Hepatitis C Screening  Completed     ----------------------------------------------------------------------------------------------------------------------------------------------------------------------------------------------------------------- Physical Exam BP 138/86   Pulse 77   Temp 98.1 F (36.7 C) (Oral)   Ht 5\' 8"  (1.727 m)   Wt 179 lb (81.2 kg)   BMI 27.22 kg/m   Physical Exam Constitutional:      Appearance: Normal appearance.  HENT:     Head: Normocephalic and atraumatic.  Eyes:     General: No scleral  icterus. Cardiovascular:     Rate and Rhythm: Normal rate and regular rhythm.  Pulmonary:     Effort: Pulmonary effort is normal.     Breath sounds: Normal breath sounds.  Musculoskeletal:     Comments: R knee normal to inspection and palpation.  No effusion noted.  ROM is normal.  Ligaments without laxity.  Negative lachman.  [place]ray without catching or pain.  Pain with thessaly test.   Skin:    General: Skin is warm and dry.  Neurological:     Mental Status: He is alert.  Psychiatric:        Mood and Affect: Mood normal.        Behavior: Behavior normal.     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  Denies any new or worsening respiratory symptoms.   Right knee DJD Has had injection without much improvement previously. Continue naproxen and topical diclofenac.  Recommend scheduling with Dr. Dianah Field to discuss possibly repeating steroid injection vs visco-supplementation  BPH (benign prostatic hyperplasia) Well controlled with tamsulosin, continue.   Essential hypertension Blood pressure is at goal at for age and co-morbidities.  I recommend he continue metoprolol at current strength.  In addition they were instructed to follow a low sodium diet with regular exercise to help to maintain adequate control of blood pressure.     Meds ordered this encounter  Medications  . gabapentin (NEURONTIN) 300 MG capsule    Sig: One tab PO qHS for a week, then BID for a week, then TID. May double weekly to a max of 3,600mg /day    Dispense:  180 capsule    Refill:  3  . metoprolol tartrate (LOPRESSOR) 25 MG tablet    Sig: Take 1 tablet (25 mg total) by mouth 2 (two) times daily.    Dispense:  180 tablet    Refill:  1  .  naproxen (NAPROSYN) 500 MG tablet    Sig: TAKE 1 TABLET BY MOUTH 2 TIMES DAILY WITH A MEAL prn pain    Dispense:  180 tablet    Refill:  1  . tamsulosin (FLOMAX) 0.4 MG CAPS capsule    Sig: TAKE 1 CAPSULE (0.4 MG TOTAL) BY MOUTH DAILY AFTER BREAKFAST.    Dispense:  90 capsule    Refill:  1    Return in about 6 months (around 12/13/2019) for HTN.    This visit occurred during the SARS-CoV-2 public health emergency.  Safety protocols were in place, including screening questions prior to the visit, additional usage of staff PPE, and extensive cleaning of exam room while observing appropriate contact time as indicated for disinfecting solutions.

## 2019-06-12 NOTE — Patient Instructions (Addendum)
Great to meet you! Please continue current medications. We'll check labs at your next visit.  Please schedule an appointment with Dr. Darene Lamer for your knee I will see you back in about 6 months.

## 2019-06-12 NOTE — Assessment & Plan Note (Signed)
Followed by pulmonology.  Denies any new or worsening respiratory symptoms.

## 2019-06-13 ENCOUNTER — Other Ambulatory Visit: Payer: Self-pay | Admitting: Family Medicine

## 2019-10-23 IMAGING — DX DG KNEE 1-2V*L*
4 series · 4 of 4 positions shown · non-contrast
Comparison: 01/20/2015

CLINICAL DATA: RIGHT greater than LEFT knee pain

EXAM:
LEFT KNEE - 1-2 VIEW

[tunnel]
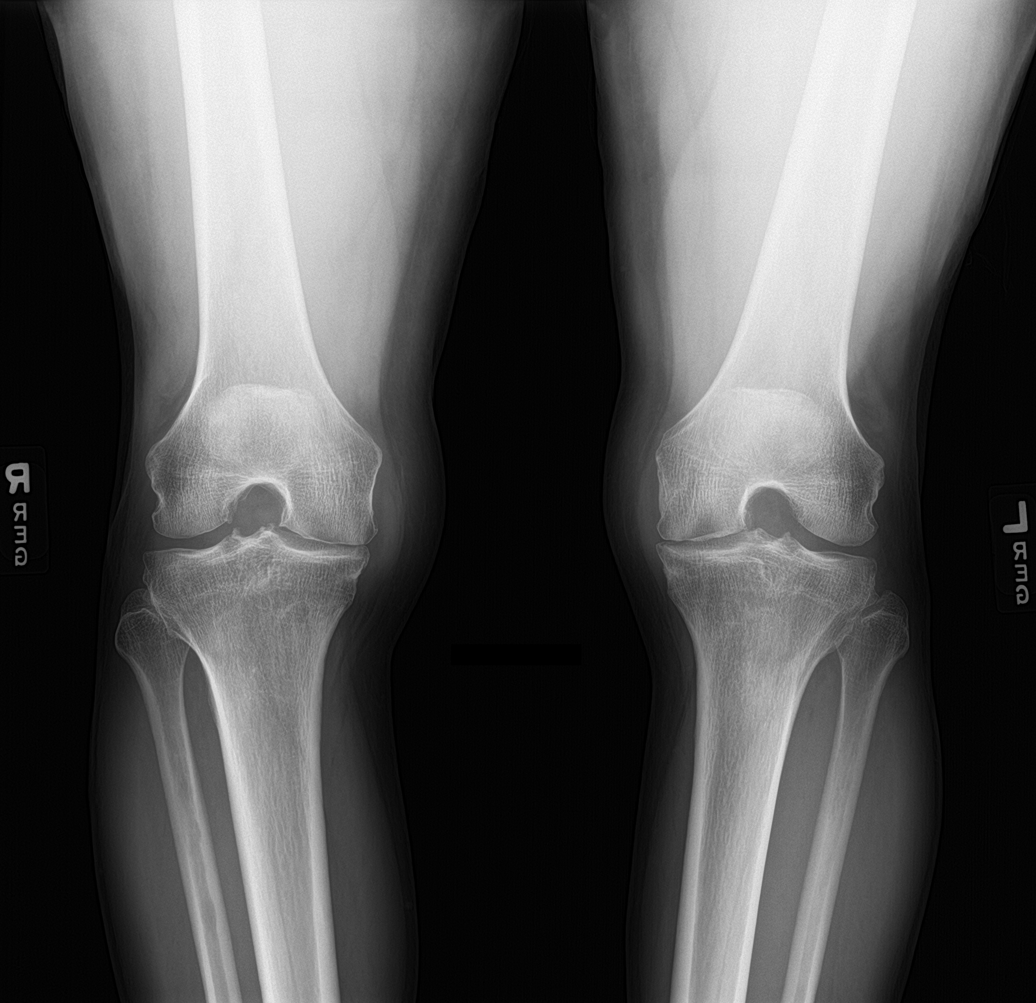

[knee lat]
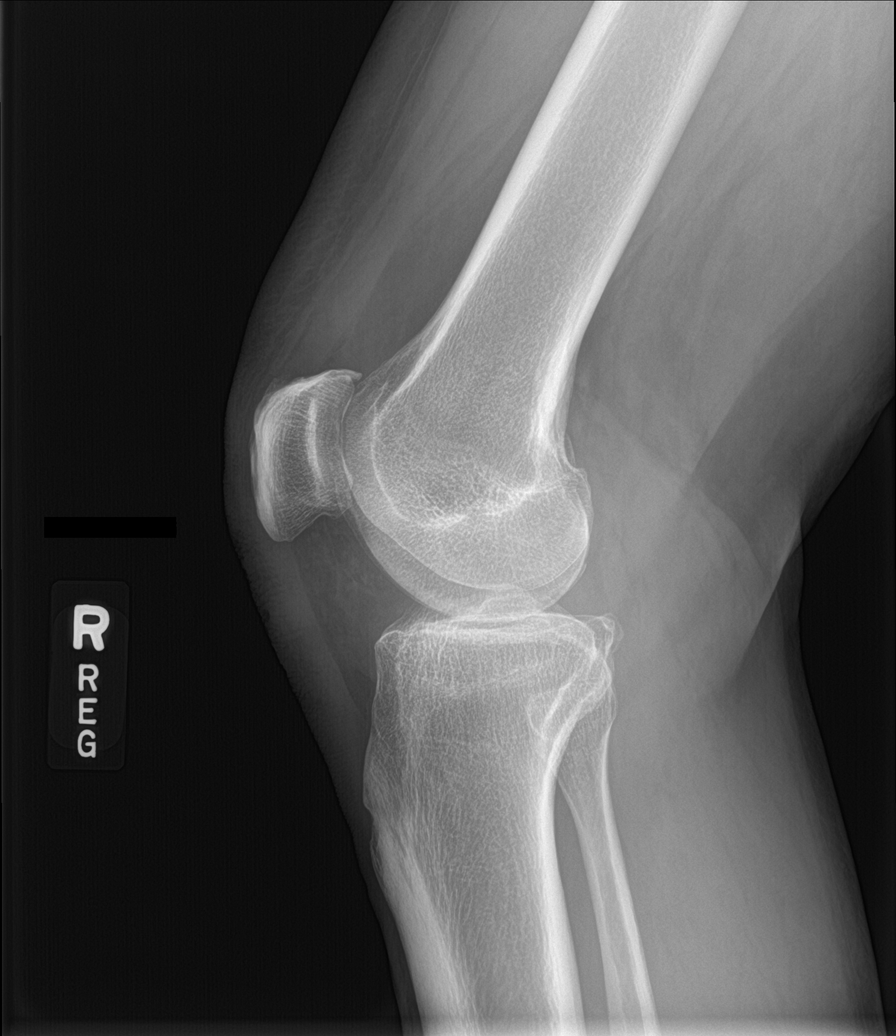

[knee sunrise]
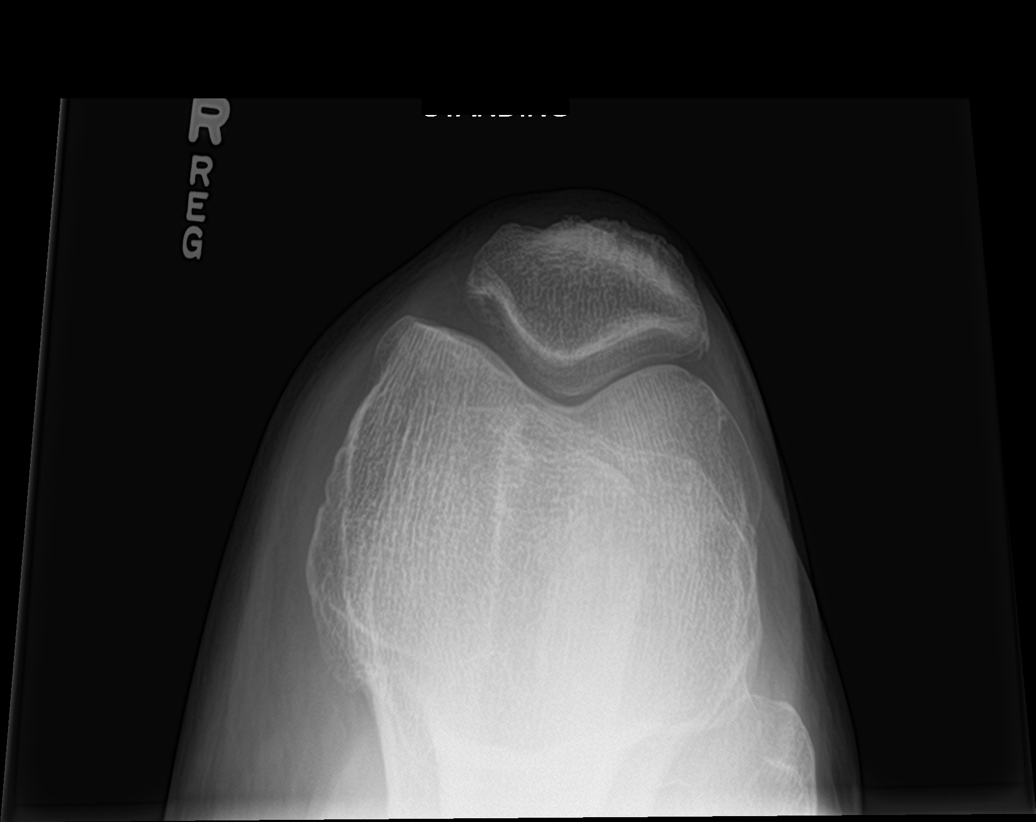

[knee ap bilat standing]
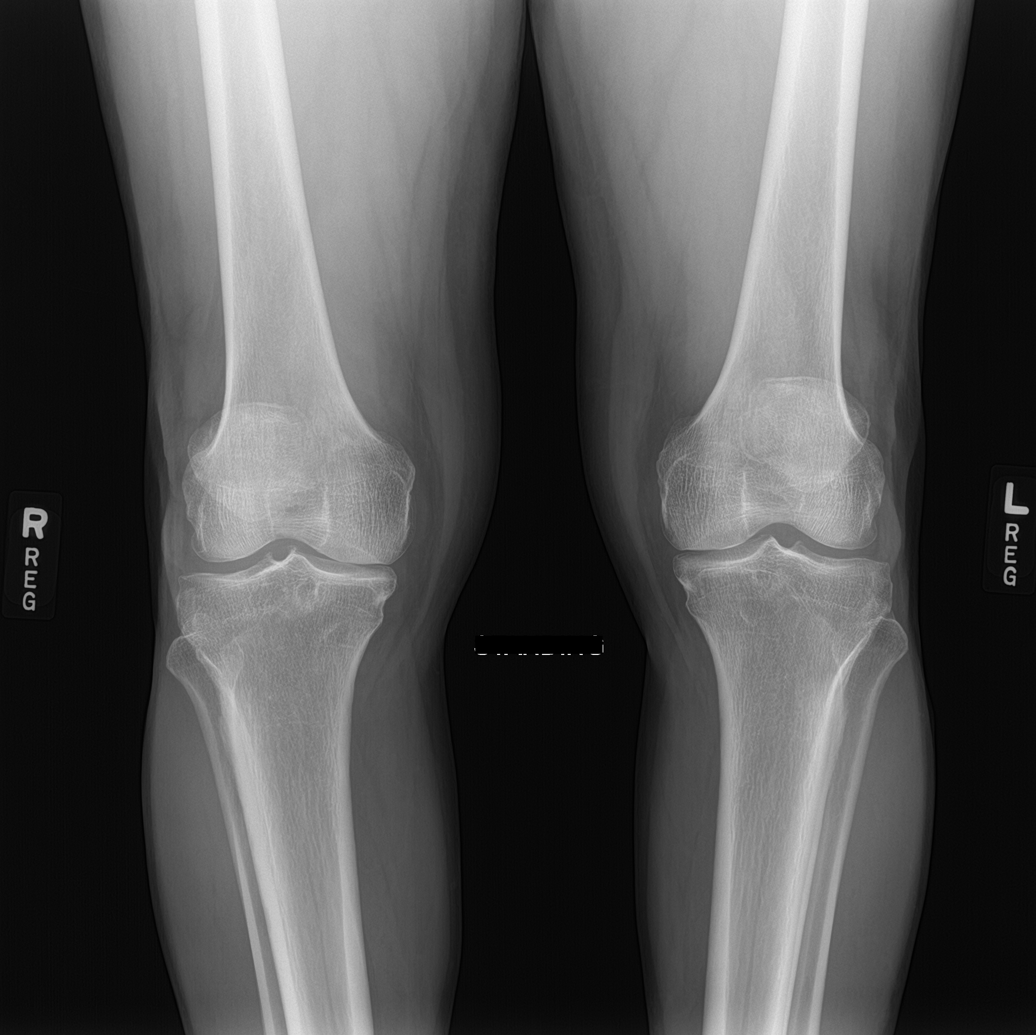

[4 of 4 positions shown; findings below may reference images not displayed]

FINDINGS: Osseous demineralization.

Medial compartment joint space narrowing.

No acute fracture, dislocation, or bone destruction identified on
single AP view.
IMPRESSION: Osseous demineralization with degenerative changes at medial
compartment LEFT knee.

## 2019-12-04 ENCOUNTER — Ambulatory Visit: Payer: Self-pay | Admitting: Family Medicine

## 2020-12-04 ENCOUNTER — Other Ambulatory Visit: Payer: Self-pay | Admitting: Family Medicine

## 2020-12-10 ENCOUNTER — Other Ambulatory Visit: Payer: Self-pay | Admitting: Family Medicine

## 2023-07-16 ENCOUNTER — Telehealth: Payer: Self-pay | Admitting: Family Medicine

## 2023-07-16 NOTE — Telephone Encounter (Signed)
 Copied from CRM [id number]. Topic: Appointments - Appointment Cancel/Reschedule >> Jul 16, 2023 10:25 AM Oddis Bench wrote: Patient/patient representative is calling to cancel  an appointment. Refer to attachments for appointment information.

## 2023-07-17 ENCOUNTER — Ambulatory Visit: Admitting: Family Medicine

## 2023-08-14 ENCOUNTER — Ambulatory Visit: Payer: Self-pay

## 2023-08-14 NOTE — Telephone Encounter (Signed)
 Copied from CRM [id number]. Topic: Clinical - Red Word Triage >> Aug 14, 2023 11:28 AM Danelle Dunning F wrote: Red Word that prompted transfer to Nurse Triage:   Patient has had neck and back pain; sometimes makes his knees go weak; patient had a injury years ago that he feels is causing this matter   Chief Complaint: Back pain Symptoms: Lower back pain radiating to legs, neck pain Frequency: Intermittent  Disposition: [] ED /[] Urgent Care (no appt availability in office) / [x] Appointment(In office/virtual)/ []  [facility]/ [] Home Care/ [] Refused Recommended Disposition /[] [facility]/ []  Follow-up with PCP Additional Notes: Patient calling to set up a new patient appointment. Patient reports a history of lower back pain from a previous injury that is intermittent. He denies any new symptoms, but states his pain has been worse recently and radiates to his legs. New patient appointment made for 6/10.    Reason for Disposition  Back pain is a chronic symptom (recurrent or ongoing AND present > 4 weeks)  Answer Assessment - Initial Assessment Questions 1. ONSET: "When did the pain begin?"      Years ago, worse recently  2. LOCATION: "Where does it hurt?" (upper, mid or lower back)     Lower back  3. SEVERITY: "How bad is the pain?"  (e.g., Scale 1-10; mild, moderate, or severe)   - MILD (1-3): Doesn't interfere with normal activities.    - MODERATE (4-7): Interferes with normal activities or awakens from sleep.    - SEVERE (8-10): Excruciating pain, unable to do any normal activities.      Moderate to severe when present  4. PATTERN: "Is the pain constant?" (e.g., yes, no; constant, intermittent)      Intermittent  5. RADIATION: "Does the pain shoot into your legs or somewhere else?"     To legs  6. CAUSE:  "What do you think is causing the back pain?"      Previous injury years ago  7. BACK OVERUSE:  "Any recent lifting of heavy objects, strenuous work or exercise?"      No 8. MEDICINES: "What have you taken so far for the pain?" (e.g., nothing, acetaminophen, NSAIDS)     No 9. NEUROLOGIC SYMPTOMS: "Do you have any weakness, numbness, or problems with bowel/bladder control?"     Arm numbness at night when laying down 10. OTHER SYMPTOMS: "Do you have any other symptoms?" (e.g., fever, abdomen pain, burning with urination, blood in urine)       Neck pain and shoulder pain  Protocols used: Back Pain-A-AH

## 2023-08-28 ENCOUNTER — Encounter: Payer: Self-pay | Admitting: Family Medicine

## 2023-08-28 ENCOUNTER — Ambulatory Visit (INDEPENDENT_AMBULATORY_CARE_PROVIDER_SITE_OTHER): Admitting: Family Medicine

## 2023-08-28 VITALS — BP 101/63 | HR 80 | Temp 97.9°F | Resp 18 | Ht 68.0 in | Wt 171.0 lb

## 2023-08-28 DIAGNOSIS — M5442 Lumbago with sciatica, left side: Secondary | ICD-10-CM

## 2023-08-28 DIAGNOSIS — Z7689 Persons encountering health services in other specified circumstances: Secondary | ICD-10-CM

## 2023-08-28 DIAGNOSIS — M5441 Lumbago with sciatica, right side: Secondary | ICD-10-CM

## 2023-08-28 DIAGNOSIS — G479 Sleep disorder, unspecified: Secondary | ICD-10-CM | POA: Diagnosis not present

## 2023-08-28 DIAGNOSIS — G8929 Other chronic pain: Secondary | ICD-10-CM

## 2023-08-28 DIAGNOSIS — K219 Gastro-esophageal reflux disease without esophagitis: Secondary | ICD-10-CM

## 2023-08-28 DIAGNOSIS — J61 Pneumoconiosis due to asbestos and other mineral fibers: Secondary | ICD-10-CM

## 2023-08-28 MED ORDER — TRELEGY ELLIPTA 100-62.5-25 MCG/ACT IN AEPB: 1.0000 | INHALATION_SPRAY | Freq: Every day | RESPIRATORY_TRACT | 5 refills | Status: DC

## 2023-08-28 MED ORDER — OMEPRAZOLE 40 MG PO CPDR: 40.0000 mg | DELAYED_RELEASE_CAPSULE | Freq: Every day | ORAL | 1 refills | Status: DC

## 2023-08-28 NOTE — Progress Notes (Addendum)
 New Patient Office Visit  Subjective    Patient ID: Michael Gallegos, male    DOB: [birth year]  Age: 73 y.o. MRN: [id number]  CC:  Chief Complaint  Patient presents with   Establish Care    Patient is here to establish care with a new PCP, He would like to discuss some pain that he has been having with his lower back pain , pain radiates down both legs, neck pain, shoulder pain bilateral, and numbness in both hands. This pain comes from an injury that occurred back in 1998.    HPI Michael Gallegos presents to establish care. Pt is new to me. From [place].  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 He is on disability from this. He use to take medicines for his back pain and would like referral for chronic pain management.  Wife is here and says he needs to see Neurologist due to sleep disturbances. Wife says he wakes up yelling with eyes open but he's still asleep. Wife reports this has been present for a while. Pt doesn't sleep well and may also have limb movements during sleep. Wife states he appears to be awake with eyes open but when she tries to talk to him; he doesn't respond as if he's still asleep. They are requesting further evaluation for sleep disturbances. Pt has hx of asbestosis. Using Trelegy inhaler and needs refills on this.  He has BPH and using Flomax  0.4mg  daily.  He has hx of GERD and using Omeprazole  40mg  and needs this refilled.    Outpatient Encounter Medications as of 08/28/2023  Medication Sig   AMBULATORY NON FORMULARY MEDICATION Single glucometer with lancets, test strips. Test daily . Prediabetes   diclofenac  sodium (VOLTAREN ) 1 % GEL APPLY FOUR GRAMS TO THE AFFECTED JOINT FOUR TIMES PER DAY   fluticasone  (FLONASE ) 50 MCG/ACT nasal spray Place 1-2 sprays into both nostrils daily.   Fluticasone -Umeclidin-Vilant (TRELEGY ELLIPTA ) 100-62.5-25 MCG/ACT AEPB Inhale into the lungs.   glucose blood test strip Please include preferred  meter. Check fasting blood sugar daily. Diagnosis Pre diabetes ICD 10 R73.03   Lancets MISC Check fasting blood sugar daily. Diagnosis Pre diabetes ICD 10 R73.03   omeprazole  (PRILOSEC) 40 MG capsule Take 1 capsule (40 mg total) by mouth 2 (two) times daily.   rosuvastatin (CRESTOR) 10 MG tablet Take 10 mg by mouth daily.   sucralfate  (CARAFATE ) 1 g tablet TAKE 1 TABLET (1 G TOTAL) BY MOUTH 4 (FOUR) TIMES DAILY.   tamsulosin  (FLOMAX ) 0.4 MG CAPS capsule TAKE 1 CAPSULE (0.4 MG TOTAL) BY MOUTH DAILY AFTER BREAKFAST.   triamcinolone  cream (KENALOG ) 0.1 % Apply 1 application topically 2 (two) times daily. (Patient not taking: Reported on 08/28/2023)   [DISCONTINUED] aspirin  EC 81 MG tablet Take 1 tablet (81 mg total) by mouth daily.   [DISCONTINUED] gabapentin  (NEURONTIN ) 300 MG capsule One tab PO qHS for a week, then BID for a week, then TID. May double weekly to a max of 3,600mg /day   [DISCONTINUED] metoprolol  tartrate (LOPRESSOR ) 25 MG tablet Take 1 tablet (25 mg total) by mouth 2 (two) times daily.   [DISCONTINUED] naproxen  (NAPROSYN ) 500 MG tablet TAKE 1 TABLET BY MOUTH 2 TIMES DAILY WITH A MEAL prn pain   No facility-administered encounter medications on file as of 08/28/2023.    Past Medical History:  Diagnosis Date   Chronic lung disease    Asbestos   Hydrocele, right 11/14/2018   [facility]  2019.  See scanned document.   Prediabetes     Past Surgical History:  Procedure Laterality Date   UMBILICAL HERNIA REPAIR      Family History  Problem Relation Age of Onset   Hypertension Father    Heart disease Neg Hx     Social History   Socioeconomic History   Marital status: Married    Spouse name: Michael Gallegos   Number of children: 7   Years of education: 11   Highest education level: 11th grade  Occupational History   Occupation: Holiday representative- Statistician    Comment: retired  Tobacco Use   Smoking status: Never    Passive exposure: Never   Smokeless tobacco: Never   Vaping Use   Vaping status: Never Used  Substance and Sexual Activity   Alcohol use: Not Currently    Alcohol/week: 0.0 standard drinks of alcohol    Comment: Occasional   Drug use: No   Sexual activity: Yes  Other Topics Concern   Not on file  Social History Narrative   Has joined the [employer] and is going to start going to the gym.Coffee and ginger tea daily   Social Drivers of Corporate investment banker Strain: Low Risk  (04/30/2018)   Overall Financial Resource Strain (CARDIA)    Difficulty of Paying Living Expenses: Not hard at all  Food Insecurity: No Food Insecurity (08/28/2023)   Hunger Vital Sign    Worried About Running Out of Food in the Last Year: Never true    Ran Out of Food in the Last Year: Never true  Transportation Needs: No Transportation Needs (04/30/2018)   PRAPARE - Administrator, Civil Service (Medical): No    Lack of Transportation (Non-Medical): No  Physical Activity: Inactive (04/30/2018)   Exercise Vital Sign    Days of Exercise per Week: 0 days    Minutes of Exercise per Session: 0 min  Stress: No Stress Concern Present (04/30/2018)   [employer] of Occupational Health - Occupational Stress Questionnaire    Feeling of Stress : Not at all  Social Connections: Unknown (08/01/2021)   Received from [facility]   Social Network    Social Network: Not on file  Intimate Partner Violence: Unknown (06/23/2021)   Received from [facility]   HITS    Physically Hurt: Not on file    Insult or Talk Down To: Not on file    Threaten Physical Harm: Not on file    Scream or Curse: Not on file    Review of Systems  Musculoskeletal:  Positive for back pain and joint pain.  Psychiatric/Behavioral:         Sleep disturbances  All other systems reviewed and are negative.      Objective    There were no vitals taken for this visit.  Physical Exam Vitals and nursing note reviewed.  Constitutional:      Appearance: Normal appearance. He is  normal weight.  HENT:     Head: Normocephalic and atraumatic.     Right Ear: External ear normal.     Left Ear: External ear normal.     Nose: Nose normal.     Mouth/Throat:     Mouth: Mucous membranes are moist.     Pharynx: Oropharynx is clear.  Eyes:     Conjunctiva/sclera: Conjunctivae normal.     Pupils: Pupils are equal, round, and reactive to light.  Cardiovascular:     Rate and Rhythm: Normal rate and  regular rhythm.     Pulses: Normal pulses.     Heart sounds: Normal heart sounds.  Pulmonary:     Effort: Pulmonary effort is normal.     Breath sounds: Normal breath sounds.  Skin:    General: Skin is warm.     Capillary Refill: Capillary refill takes less than 2 seconds.  Neurological:     General: No focal deficit present.     Mental Status: He is alert and oriented to person, place, and time. Mental status is at baseline.  Psychiatric:        Mood and Affect: Mood normal.        Behavior: Behavior normal.        Thought Content: Thought content normal.        Judgment: Judgment normal.       Assessment & Plan:   Problem List Items Addressed This Visit   None Encounter to establish care with new doctor  Chronic bilateral low back pain with bilateral sciatica -     Ambulatory referral to Physical Medicine Rehab  Sleep disturbance -     Ambulatory referral to Sleep Studies  Asbestosis (HCC) -     Trelegy Ellipta ; Inhale 1 puff into the lungs daily in the afternoon.  Dispense: 28 each; Refill: 5  Gastroesophageal reflux disease without esophagitis -     Omeprazole ; Take 1 capsule (40 mg total) by mouth daily.  Dispense: 90 capsule; Refill: 1   Pt with chronic back pain. Send to PMR for further management. Pt with sleep disturbances, send to sleep clinic for evaluation Chronic asbestosis, refilled Trelegy.  Gerd chronic and controlled. Refilled Omeprazole  40mg  daily Plan to see back in 4 weeks for CPE/labs.  No follow-ups on file.   Manette Section,  MD

## 2023-09-26 ENCOUNTER — Ambulatory Visit (INDEPENDENT_AMBULATORY_CARE_PROVIDER_SITE_OTHER): Admitting: Family Medicine

## 2023-09-26 ENCOUNTER — Encounter: Payer: Self-pay | Admitting: Family Medicine

## 2023-09-26 VITALS — BP 122/77 | HR 72 | Temp 98.0°F | Ht 68.0 in | Wt 168.7 lb

## 2023-09-26 DIAGNOSIS — R7302 Impaired glucose tolerance (oral): Secondary | ICD-10-CM | POA: Diagnosis not present

## 2023-09-26 DIAGNOSIS — Z136 Encounter for screening for cardiovascular disorders: Secondary | ICD-10-CM

## 2023-09-26 DIAGNOSIS — Z1211 Encounter for screening for malignant neoplasm of colon: Secondary | ICD-10-CM | POA: Diagnosis not present

## 2023-09-26 DIAGNOSIS — Z125 Encounter for screening for malignant neoplasm of prostate: Secondary | ICD-10-CM | POA: Diagnosis not present

## 2023-09-26 DIAGNOSIS — Z0000 Encounter for general adult medical examination without abnormal findings: Secondary | ICD-10-CM | POA: Diagnosis not present

## 2023-09-26 DIAGNOSIS — Z13220 Encounter for screening for lipoid disorders: Secondary | ICD-10-CM

## 2023-09-26 NOTE — Progress Notes (Signed)
 Complete physical exam  Patient: Michael Gallegos   DOB: [birth year]   73 y.o. Male  MRN: [id number]  Subjective:    Chief Complaint  Patient presents with   Annual Exam    Michael Gallegos is a 73 y.o. male who presents today for a complete physical exam. He reports consuming a general diet. House work He generally feels well. He reports sleeping well. He does not have additional problems to discuss today.    Most recent fall risk assessment:    08/28/2023    3:39 PM  Fall Risk   Falls in the past year? 0  Number falls in past yr: 0  Injury with Fall? 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  See scanned document.   Prediabetes    Past Surgical History:  Procedure Laterality Date   UMBILICAL HERNIA REPAIR     Social History   Socioeconomic History   Marital status: Married    Spouse name: Audry   Number of children: 7   Years of education: 11   Highest education level: 11th grade  Occupational History   Occupation: Holiday representative- Statistician    Comment: retired  Tobacco Use    Smoking status: Never    Passive exposure: Never   Smokeless tobacco: Never  Vaping Use   Vaping status: Never Used  Substance and Sexual Activity   Alcohol use: Not Currently    Alcohol/week: 0.0 standard drinks of alcohol    Comment: Occasional   Drug use: No   Sexual activity: Yes  Other Topics Concern   Not on file  Social History Narrative   Has joined the [employer] and is going to start going to the gym.Coffee and ginger tea daily   Social Drivers of Corporate investment banker Strain: Low Risk  (08/28/2023)   Overall Financial Resource Strain (CARDIA)    Difficulty of Paying Living Expenses: Not hard at all  Food Insecurity: No Food Insecurity (08/28/2023)   Hunger Vital Sign    Worried About Running Out of Food in the Last Year: Never true    Ran Out of Food in the Last Year: Never true  Transportation Needs: No Transportation Needs (08/28/2023)   PRAPARE - Administrator, Civil Service (Medical): No    Lack of Transportation (Non-Medical): No  Physical Activity: Inactive (08/28/2023)   Exercise Vital Sign    Days of Exercise per Week: 0 days    Minutes of Exercise  per Session: 0 min  Stress: No Stress Concern Present (08/28/2023)   [employer] of Occupational Health - Occupational Stress Questionnaire    Feeling of Stress : Not at all  Social Connections: Moderately Integrated (08/28/2023)   Social Connection and Isolation Panel    Frequency of Communication with Friends and Family: More than three times a week    Frequency of Social Gatherings with Friends and Family: Once a week    Attends Religious Services: More than 4 times per year    Active Member of [employer] or Organizations: No    Attends Banker Meetings: Never    Marital Status: Married  Catering manager Violence: Not At Risk (08/28/2023)   Humiliation, Afraid, Rape, and Kick questionnaire    Fear of Current or Ex-Partner: No    Emotionally Abused: No    Physically Abused: No     Sexually Abused: No   Family Status  Relation Name Status   Mother  Deceased   Father  Deceased   Neg Hx  (Not Specified)  No partnership data on file   No Known Allergies    Patient Care Team: Colette Torrence GRADE, MD as PCP - General (Family Medicine) Lindaann Dunnings, MD as Referring Physician (Internal Medicine) Devere Lonni Righter, MD as Consulting Physician (Urology) Sharleen Leu, MD as Referring Physician (Pulmonary Disease)   Outpatient Medications Prior to Visit  Medication Sig   AMBULATORY NON FORMULARY MEDICATION Single glucometer with lancets, test strips. Test daily . Prediabetes   diclofenac  sodium (VOLTAREN ) 1 % GEL APPLY FOUR GRAMS TO THE AFFECTED JOINT FOUR TIMES PER DAY   fluticasone  (FLONASE ) 50 MCG/ACT nasal spray Place 1-2 sprays into both nostrils daily.   Fluticasone -Umeclidin-Vilant (TRELEGY ELLIPTA ) 100-62.5-25 MCG/ACT AEPB Inhale 1 puff into the lungs daily in the afternoon.   glucose blood test strip Please include preferred meter. Check fasting blood sugar daily. Diagnosis Pre diabetes ICD 10 R73.03   Lancets MISC Check fasting blood sugar daily. Diagnosis Pre diabetes ICD 10 R73.03   omeprazole  (PRILOSEC) 40 MG capsule Take 1 capsule (40 mg total) by mouth daily.   rosuvastatin (CRESTOR) 10 MG tablet Take 10 mg by mouth daily.   sucralfate  (CARAFATE ) 1 g tablet TAKE 1 TABLET (1 G TOTAL) BY MOUTH 4 (FOUR) TIMES DAILY.   tamsulosin  (FLOMAX ) 0.4 MG CAPS capsule TAKE 1 CAPSULE (0.4 MG TOTAL) BY MOUTH DAILY AFTER BREAKFAST.   triamcinolone  cream (KENALOG ) 0.1 % Apply 1 application topically 2 (two) times daily. (Patient not taking: Reported on 08/28/2023)   No facility-administered medications prior to visit.    Review of Systems  All other systems reviewed and are negative.         Objective:     There were no vitals taken for this visit. BP Readings from Last 3 Encounters:  09/26/23 122/77  08/28/23 101/63  06/12/19 138/86       Physical Exam Vitals and nursing note reviewed.  Constitutional:      Appearance: Normal appearance. He is normal weight.  HENT:     Head: Normocephalic and atraumatic.     Right Ear: Tympanic membrane, ear canal and external ear normal.     Left Ear: Tympanic membrane, ear canal and external ear normal.     Nose: Nose normal.     Mouth/Throat:     Mouth: Mucous membranes are moist.     Pharynx: Oropharynx is clear.  Eyes:     Conjunctiva/sclera: Conjunctivae normal.     Pupils:  Pupils are equal, round, and reactive to light.  Cardiovascular:     Rate and Rhythm: Normal rate and regular rhythm.     Pulses: Normal pulses.     Heart sounds: Normal heart sounds.  Pulmonary:     Effort: Pulmonary effort is normal.     Breath sounds: Normal breath sounds.  Abdominal:     General: Abdomen is flat. Bowel sounds are normal.  Musculoskeletal:        General: Normal range of motion.     Cervical back: Normal range of motion and neck supple.  Skin:    General: Skin is warm.     Capillary Refill: Capillary refill takes less than 2 seconds.  Neurological:     General: No focal deficit present.     Mental Status: He is alert and oriented to person, place, and time. Mental status is at baseline.  Psychiatric:        Mood and Affect: Mood normal.        Behavior: Behavior normal.        Thought Content: Thought content normal.        Judgment: Judgment normal.     No results found for any visits on 09/26/23. Last CBC Lab Results  Component Value Date   WBC 4.1 11/05/2018   HGB 14.9 11/05/2018   HCT 43.9 11/05/2018   MCV 88.2 11/05/2018   MCH 29.9 11/05/2018   RDW 13.3 11/05/2018   PLT 288 11/05/2018   Last metabolic panel Lab Results  Component Value Date   GLUCOSE 124 (H) 11/05/2018   NA 138 11/05/2018   K 4.1 11/05/2018   CL 104 11/05/2018   CO2 26 11/05/2018   BUN 15 11/05/2018   CREATININE 1.03 11/05/2018   GFRNONAA 74 11/05/2018   CALCIUM 9.9 11/05/2018   PROT  7.4 11/05/2018   ALBUMIN 4.2 04/27/2016   BILITOT 0.4 11/05/2018   ALKPHOS 42 04/27/2016   AST 15 11/05/2018   ALT 15 11/05/2018   Last lipids Lab Results  Component Value Date   CHOL 208 (H) 11/05/2018   HDL 57 11/05/2018   LDLCALC 131 (H) 11/05/2018   TRIG 97 11/05/2018   CHOLHDL 3.6 11/05/2018   Last hemoglobin A1c Lab Results  Component Value Date   HGBA1C 5.7 (H) 11/05/2018        Assessment & Plan:    Routine Health Maintenance and Physical Exam  Immunization History  Administered Date(s) Administered   Influenza, High Dose Seasonal PF 02/25/2021   Moderna Covid-19 Vaccine Bivalent Booster 45yrs & up 03/04/2021   Moderna Sars-Covid-2 Vaccination 09/10/2019, 10/08/2019, 05/21/2020   PNEUMOCOCCAL CONJUGATE-20 03/04/2021    Health Maintenance  Topic Date Due   DTaP/Tdap/Td (1 - Tdap) Never done   Zoster Vaccines- Shingrix (1 of 2) Never done   Medicare Annual Wellness (AWV)  05/01/2019   COVID-19 Vaccine (5 - 2024-25 season) 11/19/2022   INFLUENZA VACCINE  10/19/2023   Colonoscopy  01/09/2027   Pneumococcal Vaccine: 50+ Years  Completed   Hepatitis C Screening  Completed   Hepatitis B Vaccines  Aged Out   HPV VACCINES  Aged Out   Meningococcal B Vaccine  Aged Out    Discussed health benefits of physical activity, and encouraged him to engage in regular exercise appropriate for his age and condition.  Problem List Items Addressed This Visit   None  No follow-ups on file. Annual physical exam  Encounter for lipid screening for cardiovascular disease -  Lipid panel  Impaired glucose tolerance -     CBC with Differential/Platelet -     Comprehensive metabolic panel with GFR -     Hemoglobin A1c  Screening PSA (prostate specific antigen) -     PSA  Screening for colon cancer -     Ambulatory referral to Gastroenterology   Screening labs Refer for colonoscopy See in 6 months sooner prn    Chantrell Apsey CINDERELLA Barrier, MD

## 2023-09-27 ENCOUNTER — Ambulatory Visit: Payer: Self-pay | Admitting: Family Medicine

## 2023-09-27 LAB — CBC WITH DIFFERENTIAL/PLATELET
Basophils Absolute: 0 x10E3/uL (ref 0.0–0.2)
Basos: 0 %
EOS (ABSOLUTE): 0.2 x10E3/uL (ref 0.0–0.4)
Eos: 3 %
Hematocrit: 46.2 % (ref 37.5–51.0)
Hemoglobin: 14.7 g/dL (ref 13.0–17.7)
Immature Grans (Abs): 0 x10E3/uL (ref 0.0–0.1)
Immature Granulocytes: 0 %
Lymphocytes Absolute: 4.1 x10E3/uL — ABNORMAL HIGH (ref 0.7–3.1)
Lymphs: 78 %
MCH: 30.1 pg (ref 26.6–33.0)
MCHC: 31.8 g/dL (ref 31.5–35.7)
MCV: 95 fL (ref 79–97)
Monocytes Absolute: 0.5 x10E3/uL (ref 0.1–0.9)
Monocytes: 9 %
Neutrophils Absolute: 0.5 x10E3/uL — ABNORMAL LOW (ref 1.4–7.0)
Neutrophils: 10 %
Platelets: 233 x10E3/uL (ref 150–450)
RBC: 4.89 x10E6/uL (ref 4.14–5.80)
RDW: 12.8 % (ref 11.6–15.4)
WBC: 5.3 x10E3/uL (ref 3.4–10.8)

## 2023-09-27 LAB — COMPREHENSIVE METABOLIC PANEL WITH GFR
ALT: 19 IU/L (ref 0–44)
AST: 20 IU/L (ref 0–40)
Albumin: 4.3 g/dL (ref 3.8–4.8)
Alkaline Phosphatase: 72 IU/L (ref 44–121)
BUN/Creatinine Ratio: 16 (ref 10–24)
BUN: 19 mg/dL (ref 8–27)
Bilirubin Total: 0.5 mg/dL (ref 0.0–1.2)
CO2: 23 mmol/L (ref 20–29)
Calcium: 9.4 mg/dL (ref 8.6–10.2)
Chloride: 101 mmol/L (ref 96–106)
Creatinine, Ser: 1.18 mg/dL (ref 0.76–1.27)
Globulin, Total: 2.4 g/dL (ref 1.5–4.5)
Glucose: 92 mg/dL (ref 70–99)
Potassium: 5 mmol/L (ref 3.5–5.2)
Sodium: 142 mmol/L (ref 134–144)
Total Protein: 6.7 g/dL (ref 6.0–8.5)
eGFR: 65 mL/min/1.73 (ref >59)

## 2023-09-27 LAB — HEMOGLOBIN A1C
Est. average glucose Bld gHb Est-mCnc: 117 mg/dL
Hgb A1c MFr Bld: 5.7 % — ABNORMAL HIGH (ref 4.8–5.6)

## 2023-09-27 LAB — LIPID PANEL
Chol/HDL Ratio: 3 ratio (ref 0.0–5.0)
Cholesterol, Total: 144 mg/dL (ref 100–199)
HDL: 48 mg/dL (ref >39)
LDL Chol Calc (NIH): 82 mg/dL (ref 0–99)
Triglycerides: 72 mg/dL (ref 0–149)
VLDL Cholesterol Cal: 14 mg/dL (ref 5–40)

## 2023-09-27 LAB — PSA: Prostate Specific Ag, Serum: 0.8 ng/mL (ref 0.0–4.0)

## 2023-10-02 ENCOUNTER — Other Ambulatory Visit: Payer: Self-pay | Admitting: Family Medicine

## 2023-10-02 DIAGNOSIS — J61 Pneumoconiosis due to asbestos and other mineral fibers: Secondary | ICD-10-CM

## 2023-10-02 MED ORDER — TRELEGY ELLIPTA 100-62.5-25 MCG/ACT IN AEPB: 1.0000 | INHALATION_SPRAY | Freq: Every day | RESPIRATORY_TRACT | 5 refills | Status: AC

## 2023-10-02 NOTE — Telephone Encounter (Signed)
 Copied from CRM [id number]. Topic: Clinical - Medication Refill >> Oct 02, 2023 12:13 PM Shamecia H wrote: Medication: Fluticasone -Umeclidin-Vilant (TRELEGY ELLIPTA ) 100-62.5-25 MCG/ACT AEPB  Has the patient contacted their pharmacy? Yes (Agent: If no, request that the patient contact the pharmacy for the refill. If patient does not wish to contact the pharmacy document the reason why and proceed with request.) (Agent: If yes, when and what did the pharmacy advise?)  This is the patient's preferred pharmacy:  [id number] [place], [place] - [address] [place] [facility] [address] Phone: [phone] Fax: [phone]  Is this the correct pharmacy for this prescription? Yes If no, delete pharmacy and type the correct one.   Has the prescription been filled recently? Yes  Is the patient out of the medication? Yes  Has the patient been seen for an appointment in the last year OR does the patient have an upcoming appointment? Yes  Can we respond through MyChart? Yes  Agent: Please be advised that Rx refills may take up to 3 business days. We ask that you follow-up with your pharmacy.

## 2023-10-16 ENCOUNTER — Telehealth: Payer: Self-pay

## 2023-10-16 NOTE — Telephone Encounter (Signed)
 Copied from CRM [id number]. Topic: Clinical - Lab/Test Results >> Oct 16, 2023  2:04 PM Cherylann RAMAN wrote: Reason for CRM: Patient is requesting lab results from his physical that was completed on 07/29. Please contact patient at [phone]

## 2023-10-16 NOTE — Telephone Encounter (Signed)
 Spoke with patient and gave him lab results per his request. Informed patient that a lab letter was mailed out earlier in the month, but also advised him that I would be putting another one in the mail just in case he did not receive the first letter. Patient gave a verbal understanding

## 2023-11-30 ENCOUNTER — Ambulatory Visit: Payer: Self-pay

## 2023-11-30 NOTE — Telephone Encounter (Signed)
 FYI Only or Action Required?: Action required by provider: referral request.  Patient was last seen in primary care on 09/26/2023 by Colette Torrence GRADE, MD.  Called Nurse Triage reporting Back Pain.  Symptoms began several years ago.  Interventions attempted: OTC medications: Ibuprofen and Prescription medications: Gabapentin .  Symptoms are: gradually worsening.  Triage Disposition: See PCP When Office is Open (Within 3 Days)  Patient/caregiver understands and will follow disposition?: Yes     Copied from CRM #[id number]. Topic: Clinical - Red Word Triage >> Nov 30, 2023  1:12 PM Fredrica W wrote: Red Word that prompted transfer to Nurse Triage: Worsening pain - side from work injury - request workers comp doctor. Reason for Disposition  [1] Pain radiates into the thigh or further down the leg AND [2] one leg  Answer Assessment - Initial Assessment Questions Patient states he had a work injury that occurred years ago. Patient states he is on workers comp. L hip pain and legs started to feel weak as well with symptoms. Pt states he has a bowl movement every 3- 4 days that is like small knots. Taking miralax  for symptoms.  Patient states symptoms have worsened in the last 1.5 weeks with a saw like feeling in back. Patient requesting a referral to Sports Medicine if possible.    1. ONSET: When did the pain begin? (e.g., minutes, hours, days)     Years ago  2. LOCATION: Where does it hurt? (upper, mid or lower back)     Lower back  3. SEVERITY: How bad is the pain?  (e.g., Scale 1-10; mild, moderate, or severe)     At the worst pain is a 10/10 4. PATTERN: Is the pain constant? (e.g., yes, no; constant, intermittent)      Comes and goes  5. RADIATION: Does the pain shoot into your legs or somewhere else?     Radiates to legs, neck, and shoulder area.  6. CAUSE:  What do you think is causing the back pain?      Patient states symptoms started after an injury at work  7.  MEDICINES: What have you taken so far for the pain? (e.g., nothing, acetaminophen, NSAIDS)     Taking ibuprofen, and gabapentin  medication for symptoms.  8. NEUROLOGIC SYMPTOMS: Do you have any weakness, numbness, or problems with bowel/bladder control?     Patient states he has had some constipation as well.  9. OTHER SYMPTOMS: Do you have any other symptoms? (e.g., fever, abdomen pain, burning with urination, blood in urine)       Denies  Protocols used: Back Pain-A-AH

## 2023-12-03 ENCOUNTER — Ambulatory Visit: Admitting: Family Medicine

## 2023-12-03 NOTE — Telephone Encounter (Signed)
 Patient changed his mind, he stated that he feels better, he's been rubbing some medication on it and  it feels better.

## 2023-12-04 DIAGNOSIS — K219 Gastro-esophageal reflux disease without esophagitis: Secondary | ICD-10-CM | POA: Diagnosis not present

## 2023-12-04 DIAGNOSIS — Z008 Encounter for other general examination: Secondary | ICD-10-CM | POA: Diagnosis not present

## 2023-12-04 DIAGNOSIS — Z6826 Body mass index (BMI) 26.0-26.9, adult: Secondary | ICD-10-CM | POA: Diagnosis not present

## 2023-12-04 DIAGNOSIS — E663 Overweight: Secondary | ICD-10-CM | POA: Diagnosis not present

## 2023-12-25 ENCOUNTER — Ambulatory Visit: Payer: Self-pay | Admitting: *Deleted

## 2023-12-25 ENCOUNTER — Encounter: Payer: Self-pay | Admitting: Family Medicine

## 2023-12-25 ENCOUNTER — Ambulatory Visit: Admitting: Family Medicine

## 2023-12-25 VITALS — BP 128/80 | HR 72 | Temp 97.7°F | Resp 16 | Ht 68.0 in | Wt 172.0 lb

## 2023-12-25 DIAGNOSIS — B029 Zoster without complications: Secondary | ICD-10-CM

## 2023-12-25 DIAGNOSIS — R61 Generalized hyperhidrosis: Secondary | ICD-10-CM | POA: Insufficient documentation

## 2023-12-25 MED ORDER — ALUMINUM CHLORIDE 20 % EX SOLN: Freq: Every day | CUTANEOUS | 0 refills | Status: AC

## 2023-12-25 MED ORDER — PREDNISONE 20 MG PO TABS: 40.0000 mg | ORAL_TABLET | Freq: Every day | ORAL | 0 refills | Status: AC

## 2023-12-25 MED ORDER — VALACYCLOVIR HCL 1 G PO TABS: 1000.0000 mg | ORAL_TABLET | Freq: Three times a day (TID) | ORAL | 0 refills | Status: AC

## 2023-12-25 NOTE — Telephone Encounter (Signed)
 FYI Only or Action Required?: FYI only for provider.  Patient was last seen in primary care on 09/26/2023 by Colette Torrence GRADE, MD.  Called Nurse Triage reporting Back Pain.  Symptoms began yesterday.  Interventions attempted: OTC medications: motrin,gabapentin  .  Symptoms are: unchanged.  Triage Disposition: See HCP Within 4 Hours (Or PCP Triage)  Patient/caregiver understands and will follow disposition?: yes   Reason for Disposition  [1] SEVERE back pain (e.g., excruciating, unable to do any normal activities) AND [2] not improved 2 hours after pain medicine  Answer Assessment - Initial Assessment Questions 1. ONSET: When did the pain begin? (e.g., minutes, hours, days)     yesterday 2. LOCATION: Where does it hurt? (upper, mid or lower back)     Corner of back- behind lungs- left above the waistline 3. SEVERITY: How bad is the pain?  (e.g., Scale 1-10; mild, moderate, or severe)     severe 4. PATTERN: Is the pain constant? (e.g., yes, no; constant, intermittent)      constant  6. CAUSE:  What do you think is causing the back pain?      unsure 7. BACK OVERUSE:  Any recent lifting of heavy objects, strenuous work or exercise?     Went to bathroom- an pain started after- twisting back and forth 8. MEDICINES: What have you taken so far for the pain? (e.g., nothing, acetaminophen, NSAIDS)     Motrin, gabapentin  300mg   9. NEUROLOGIC SYMPTOMS: Do you have any weakness, numbness, or problems with bowel/bladder control?     no 10. OTHER SYMPTOMS: Do you have any other symptoms? (e.g., fever, abdomen pain, burning with urination, blood in urine)       no  Protocols used: Back Pain-A-AH   Copied from CRM #[id number]. Topic: Clinical - Red Word Triage >> Dec 25, 2023 12:29 PM Sophia H wrote: Red Word that prompted transfer to Nurse Triage: Severe back pain, started back up yesterday and is worsening. Req appointment

## 2023-12-25 NOTE — Patient Instructions (Addendum)
 Ice/cold pack over area for 10-15 min twice daily.  OK to take Tylenol 1000 mg (2 extra strength tabs) or 975 mg (3 regular strength tabs) every 6 hours as needed.  Try to drink 55-60 oz of water daily outside of exercise.  Take Metamucil or Benefiber daily.  Try 2 tablespoons of milk of mag in 4 oz of warm prune juice. Do that and wait a couple hours. If no improvement, try a Dulcolax suppository and then let me know if we are still having issues.   Let us  know if you need anything.

## 2023-12-25 NOTE — Progress Notes (Signed)
 Musculoskeletal Exam  Patient: Michael Gallegos DOB: [birth year]  DOS: 12/25/2023  SUBJECTIVE:  Chief Complaint:   Chief Complaint  Patient presents with   Back Pain    Back Pain    [name] is a 73 y.o.  male for evaluation and treatment of back pain.  He is here with his wife.  Onset:  1 day ago. Was twisting his back on the toilet.  Location: lower left Character:  stabbing  Progression of issue:  is unchanged Associated symptoms: His wife noticed a rash over the area No bruising or swelling. Denies bowel/bladder incontinence or weakness Treatment: to date has been acetaminophen.   Neurovascular symptoms: no  Over the past several years the patient has had issues with swelling under his arms.  It seems to take place without an adequate stimulus.  No odor.  He has not tried anything at home so far.  Past Medical History:  Diagnosis Date   Chronic lung disease    Asbestos   Hydrocele, right 11/14/2018   Evaluated [facility] 2019.  See scanned document.   Prediabetes     Objective:  VITAL SIGNS: BP 128/80 (BP Location: Left Arm, Patient Position: Sitting)   Pulse 72   Temp 97.7 F (36.5 C) (Oral)   Resp 16   Ht 5' 8 (1.727 m)   Wt 172 lb (78 kg)   SpO2 98%   BMI 26.15 kg/m  Constitutional: Well formed, well developed. No acute distress. HENT: Normocephalic, atraumatic.  Thorax & Lungs:  No accessory muscle use Musculoskeletal: low back.   Tenderness to palpation: Yes Deformity: no Ecchymosis: no Straight leg test: negative for Reasonable hamstring flexibility b/l. Skin: As below; also damp under his arms Neurologic: Normal sensory function. No focal deficits noted. DTR's equal and symmetric in LE's. No clonus. Psychiatric: Normal mood. Age appropriate judgment and insight. Alert & oriented x 3.      Assessment:  Herpes zoster without complication - Plan: predniSONE  (DELTASONE ) 20 MG tablet, valACYclovir (VALTREX) 1000 MG  tablet  Hyperhidrosis  Plan: 1.  Suspect he has shingles.  7-day burst of prednisone  40 mg daily.  Valtrex for 7 days, 1 g 3 times daily.  Ice, Tylenol as needed. 2.  Chronic, not controlled.  Start Drysol over axillary region daily.  Follow-up with regular PCP in 1 month to recheck. The patient and his wife voiced understanding and agreement to the plan.   Mabel [place], DO 12/25/23  4:59 PM

## 2024-01-07 ENCOUNTER — Other Ambulatory Visit: Payer: Self-pay | Admitting: Family Medicine

## 2024-01-07 MED ORDER — TAMSULOSIN HCL 0.4 MG PO CAPS: ORAL_CAPSULE | ORAL | 1 refills | Status: AC

## 2024-01-07 NOTE — Telephone Encounter (Signed)
 Copied from CRM #[id number]. Topic: Clinical - Medication Refill >> Jan 07, 2024 11:09 AM Everette C wrote: Medication: tamsulosin  (FLOMAX ) 0.4 MG CAPS capsule  Has the patient contacted their pharmacy? Yes (Agent: If no, request that the patient contact the pharmacy for the refill. If patient does not wish to contact the pharmacy document the reason why and proceed with request.) (Agent: If yes, when and what did the pharmacy advise?)  This is the patient's preferred pharmacy:  [id number] [place], [place] - [address] [place] [address] [facility] [address] Phone: [phone] Fax: [phone]  Is this the correct pharmacy for this prescription? Yes If no, delete pharmacy and type the correct one.   Has the prescription been filled recently? Yes  Is the patient out of the medication? No  Has the patient been seen for an appointment in the last year OR does the patient have an upcoming appointment? Yes  Can we respond through MyChart? No  Agent: Please be advised that Rx refills may take up to 3 business days. We ask that you follow-up with your pharmacy.

## 2024-01-14 ENCOUNTER — Telehealth: Payer: Self-pay | Admitting: Family Medicine

## 2024-01-14 NOTE — Telephone Encounter (Signed)
 We got pt scheduled with Darice Brownie.

## 2024-01-14 NOTE — Telephone Encounter (Signed)
 Copied from CRM #[id number]. Topic: General - Other >> Jan 14, 2024  2:01 PM Zebedee SAUNDERS wrote: Reason for CRM: Pt wants to see Dr. Colette sooner than 03/31/2024 due to singles pain. He is on waiting list. Pt would like a call back at [phone].

## 2024-01-30 ENCOUNTER — Ambulatory Visit: Admitting: Family Medicine

## 2024-03-20 ENCOUNTER — Other Ambulatory Visit: Payer: Self-pay | Admitting: Family Medicine

## 2024-03-20 DIAGNOSIS — K219 Gastro-esophageal reflux disease without esophagitis: Secondary | ICD-10-CM

## 2024-03-25 ENCOUNTER — Other Ambulatory Visit: Payer: Self-pay | Admitting: Family Medicine

## 2024-03-25 NOTE — Telephone Encounter (Signed)
 Copied from CRM [id number]. Topic: Clinical - Medication Refill >> Mar 25, 2024  2:27 PM Mercer PEDLAR wrote: Medication: fluticasone  (FLONASE ) 50 MCG/ACT nasal spray  Has the patient contacted their pharmacy? Yes (Agent: If no, request that the patient contact the pharmacy for the refill. If patient does not wish to contact the pharmacy document the reason why and proceed with request.) (Agent: If yes, when and what did the pharmacy advise?)  This is the patient's preferred pharmacy:  [id number] [place], [place] - [address] [place] [facility] [address] Phone: [phone] Fax: [phone]  Is this the correct pharmacy for this prescription? Yes If no, delete pharmacy and type the correct one.   Has the prescription been filled recently? No  Is the patient out of the medication? Yes  Has the patient been seen for an appointment in the last year OR does the patient have an upcoming appointment? Yes  Can we respond through MyChart? No  Agent: Please be advised that Rx refills may take up to 3 business days. We ask that you follow-up with your pharmacy.

## 2024-03-31 ENCOUNTER — Ambulatory Visit: Admitting: Family Medicine

## 2024-04-08 ENCOUNTER — Telehealth: Payer: Self-pay

## 2024-04-08 NOTE — Telephone Encounter (Signed)
 Copied from CRM [id number]. Topic: Clinical - Medication Question >> Apr 08, 2024  2:39 PM Willma R wrote: Reason for CRM: Patient states he is having a colonoscopy on 01/26 and he is in need of the medication he is supposed to take prior to help clear him out. Is requesting to see if his PCP can send the medication over to his pharmacy.  [id number] [address], [place] - [address] [place] [address] Phone: [phone] Fax: [phone]  Patient can be reached at [phone]

## 2024-04-08 NOTE — Telephone Encounter (Signed)
 Patient has been contacted and advised to reach out to [facility] [place] for further assistance. Phone number was provided and pt was also advised to contact our office with any further questions or concerns. Below are referral details.  [facility] [place] [address] [place], [place], [place] [address].

## 2024-04-14 ENCOUNTER — Other Ambulatory Visit: Payer: Self-pay | Admitting: Family Medicine

## 2024-04-14 MED ORDER — FLUTICASONE PROPIONATE 50 MCG/ACT NA SUSP: 1.0000 | Freq: Every day | NASAL | 12 refills | Status: AC

## 2024-04-14 NOTE — Telephone Encounter (Signed)
 Last written by historical provider

## 2024-04-14 NOTE — Telephone Encounter (Signed)
 Last written by historical provider  Copied from CRM [id number]. Topic: Clinical - Medication Refill >> Apr 14, 2024 12:41 PM Ivette P wrote: Medication:   rosuvastatin (CRESTOR) 10 MG tablet  fluticasone  (FLONASE ) 50 MCG/ACT nasal spray  Has the patient contacted their pharmacy? Yes (Agent: If no, request that the patient contact the pharmacy for the refill. If patient does not wish to contact the pharmacy document the reason why and proceed with request.) (Agent: If yes, when and what did the pharmacy advise?)  This is the patient's preferred pharmacy:  [id number] [place],  - [address] [place] [facility] [address] Phone: [phone] Fax: [phone]  Is this the correct pharmacy for this prescription? Yes If no, delete pharmacy and type the correct one.   Has the prescription been filled recently? Yes  Is the patient out of the medication? Yes, ran out   Has the patient been seen for an appointment in the last year OR does the patient have an upcoming appointment? Yes  Can we respond through MyChart? No  Agent: Please be advised that Rx refills may take up to 3 business days. We ask that you follow-up with your pharmacy.

## 2024-04-14 NOTE — Telephone Encounter (Signed)
 Copied from CRM [id number]. Topic: Clinical - Medication Refill >> Apr 14, 2024 12:41 PM Ivette P wrote: Medication: rosuvastatin (CRESTOR) 10 MG tablet  Has the patient contacted their pharmacy? Yes (Agent: If no, request that the patient contact the pharmacy for the refill. If patient does not wish to contact the pharmacy document the reason why and proceed with request.) (Agent: If yes, when and what did the pharmacy advise?)  This is the patient's preferred pharmacy:  [id number] [place], [place] - [address] [place] [facility] [address] Phone: [phone] Fax: [phone]  Is this the correct pharmacy for this prescription? Yes If no, delete pharmacy and type the correct one.   Has the prescription been filled recently? Yes  Is the patient out of the medication? Yes, ran out   Has the patient been seen for an appointment in the last year OR does the patient have an upcoming appointment? Yes  Can we respond through MyChart? No  Agent: Please be advised that Rx refills may take up to 3 business days. We ask that you follow-up with your pharmacy.

## 2024-04-24 ENCOUNTER — Other Ambulatory Visit: Payer: Self-pay | Admitting: Family Medicine

## 2024-04-24 ENCOUNTER — Telehealth: Payer: Self-pay

## 2024-04-24 DIAGNOSIS — B029 Zoster without complications: Secondary | ICD-10-CM

## 2024-04-24 NOTE — Telephone Encounter (Signed)
 Copied from CRM [id number]. Topic: Clinical - Medication Refill >> Apr 24, 2024  3:18 PM Victoria B wrote: Medication: omeprazole  (PRILOSEC) 40 MG capsule  Has the patient contacted their pharmacy? no This is the patient's preferred pharmacy:  [id number] [place], [place] - [address] [place] [address] Phone: [phone] Fax: [phone]  Is this the correct pharmacy for this prescription? yes   Has the prescription been filled recently? no  Is the patient out of the medication? yes  Has the patient been seen for an appointment in the last year OR does the patient have an upcoming appointment? yes  Can we respond through MyChart? no  Agent: Please be advised that Rx refills may take up to 3 business days. We ask that you follow-up with your pharmacy.  Contacted patient and advised medication was filled by Darice Brownie on 03/20/2024. [facility].

## 2024-09-26 ENCOUNTER — Encounter: Admitting: Family Medicine
